# Patient Record
Sex: Female | Born: 1990
Health system: Southern US, Community
[De-identification: ages and names within clinical notes are randomized; demographics above are authoritative.]

## PROBLEM LIST (undated history)

## (undated) DIAGNOSIS — L309 Dermatitis, unspecified: Secondary | ICD-10-CM

## (undated) DIAGNOSIS — F32A Depression, unspecified: Secondary | ICD-10-CM

## (undated) DIAGNOSIS — F329 Major depressive disorder, single episode, unspecified: Secondary | ICD-10-CM

## (undated) DIAGNOSIS — IMO0002 Reserved for concepts with insufficient information to code with codable children: Secondary | ICD-10-CM

## (undated) DIAGNOSIS — L509 Urticaria, unspecified: Secondary | ICD-10-CM

## (undated) DIAGNOSIS — K219 Gastro-esophageal reflux disease without esophagitis: Secondary | ICD-10-CM

## (undated) DIAGNOSIS — D721 Eosinophilia, unspecified: Secondary | ICD-10-CM

## (undated) DIAGNOSIS — F419 Anxiety disorder, unspecified: Secondary | ICD-10-CM

## (undated) DIAGNOSIS — F909 Attention-deficit hyperactivity disorder, unspecified type: Secondary | ICD-10-CM

## (undated) DIAGNOSIS — D649 Anemia, unspecified: Secondary | ICD-10-CM

## (undated) DIAGNOSIS — J45909 Unspecified asthma, uncomplicated: Secondary | ICD-10-CM

## (undated) DIAGNOSIS — T783XXA Angioneurotic edema, initial encounter: Secondary | ICD-10-CM

## (undated) DIAGNOSIS — L508 Other urticaria: Secondary | ICD-10-CM

## (undated) DIAGNOSIS — L409 Psoriasis, unspecified: Secondary | ICD-10-CM

## (undated) HISTORY — DX: Reserved for concepts with insufficient information to code with codable children: IMO0002

## (undated) HISTORY — PX: ADENOIDECTOMY: SUR15

## (undated) HISTORY — PX: SINOSCOPY: SHX187

## (undated) HISTORY — PX: TYMPANOSTOMY TUBE PLACEMENT: SHX32

## (undated) HISTORY — PX: FRACTURE SURGERY: SHX138

## (undated) HISTORY — DX: Dermatitis, unspecified: L30.9

## (undated) HISTORY — DX: Eosinophilia, unspecified: D72.10

## (undated) HISTORY — DX: Other urticaria: L50.8

## (undated) HISTORY — DX: Unspecified asthma, uncomplicated: J45.909

## (undated) HISTORY — DX: Attention-deficit hyperactivity disorder, unspecified type: F90.9

## (undated) HISTORY — DX: Depression, unspecified: F32.A

## (undated) HISTORY — DX: Angioneurotic edema, initial encounter: T78.3XXA

## (undated) HISTORY — DX: Anemia, unspecified: D64.9

## (undated) HISTORY — DX: Urticaria, unspecified: L50.9

## (undated) HISTORY — DX: Psoriasis, unspecified: L40.9

## (undated) HISTORY — DX: Gastro-esophageal reflux disease without esophagitis: K21.9

## (undated) HISTORY — DX: Major depressive disorder, single episode, unspecified: F32.9

## (undated) HISTORY — DX: Anxiety disorder, unspecified: F41.9

---

## 2008-01-02 ENCOUNTER — Emergency Department (HOSPITAL_COMMUNITY): Admission: EM | Admit: 2008-01-02 | Discharge: 2008-01-02 | Payer: Self-pay | Admitting: Emergency Medicine

## 2008-02-09 ENCOUNTER — Other Ambulatory Visit: Admission: RE | Admit: 2008-02-09 | Discharge: 2008-02-09 | Payer: Self-pay | Admitting: Family Medicine

## 2008-03-13 ENCOUNTER — Other Ambulatory Visit: Admission: RE | Admit: 2008-03-13 | Discharge: 2008-03-13 | Payer: Self-pay | Admitting: Obstetrics and Gynecology

## 2008-06-09 ENCOUNTER — Inpatient Hospital Stay (HOSPITAL_COMMUNITY): Admission: EM | Admit: 2008-06-09 | Discharge: 2008-06-12 | Payer: Self-pay | Admitting: Emergency Medicine

## 2009-04-15 ENCOUNTER — Other Ambulatory Visit: Admission: RE | Admit: 2009-04-15 | Discharge: 2009-04-15 | Payer: Self-pay | Admitting: Family Medicine
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
## 2011-09-17 LAB — CBC
Hemoglobin: 11.3 — ABNORMAL LOW
Hemoglobin: 11.5 — ABNORMAL LOW
MCHC: 34.3
RBC: 3.4 — ABNORMAL LOW
RDW: 14.1
WBC: 8.2

## 2011-09-17 LAB — ETHANOL: Alcohol, Ethyl (B): <5

## 2011-09-17 LAB — POCT PREGNANCY, URINE
Operator id: 222501
Preg Test, Ur: NEGATIVE

## 2011-09-17 LAB — RAPID URINE DRUG SCREEN, HOSP PERFORMED: Benzodiazepines: NOT DETECTED

## 2014-05-20 ENCOUNTER — Ambulatory Visit (INDEPENDENT_AMBULATORY_CARE_PROVIDER_SITE_OTHER): Payer: BC Managed Care – PPO | Admitting: Family Medicine

## 2014-05-20 VITALS — BP 110/72 | HR 89 | Temp 97.7°F | Resp 12 | Ht 65.0 in | Wt 103.6 lb

## 2014-05-20 DIAGNOSIS — N390 Urinary tract infection, site not specified: Secondary | ICD-10-CM

## 2014-05-20 DIAGNOSIS — R319 Hematuria, unspecified: Secondary | ICD-10-CM

## 2014-05-20 DIAGNOSIS — R350 Frequency of micturition: Secondary | ICD-10-CM

## 2014-05-20 DIAGNOSIS — R1011 Right upper quadrant pain: Secondary | ICD-10-CM

## 2014-05-20 LAB — POCT URINALYSIS DIPSTICK
Bilirubin, UA: NEGATIVE
Glucose, UA: NEGATIVE
Ketones, UA: NEGATIVE
NITRITE UA: NEGATIVE
PH UA: 8.5
Protein, UA: NEGATIVE
Spec Grav, UA: 1.015
UROBILINOGEN UA: 0.2

## 2014-05-20 LAB — POCT UA - MICROSCOPIC ONLY
AMORPHOUS: POSITIVE
CASTS, UR, LPF, POC: NEGATIVE
CRYSTALS, UR, HPF, POC: NEGATIVE
YEAST UA: NEGATIVE

## 2014-05-20 LAB — POCT URINE PREGNANCY: PREG TEST UR: NEGATIVE

## 2014-05-20 MED ORDER — NITROFURANTOIN MONOHYD MACRO 100 MG PO CAPS: 100.0000 mg | ORAL_CAPSULE | Freq: Two times a day (BID) | ORAL | Status: DC

## 2014-05-20 NOTE — Patient Instructions (Signed)
1.  Repeat urine in two to three weeks to confirm that blood has resolved.

## 2014-05-20 NOTE — Progress Notes (Signed)
Subjective:    Patient ID: Mariah Sullivan, female    DOB: 08-23-1991, 23 y.o.   MRN: [id number]  05/20/2014  Abdominal Pain   Abdominal Pain Associated symptoms include dysuria, frequency and hematuria. Pertinent negatives include no fever.   This 23 y.o. female presents for evaluation of flank pain, abdominal pain, cloudy urine.  Onset two days ago.  Onset of pain in R side; made it through three hours yesterday but had to leave.  R anterior rib pain with radiation into R flank.  Weight loss in last month due to stress.  Back muscles are very tense.  Two days ago, urine was the color of sweet tea with red particles in urine at end of urination.  Googled over the weekend; stayed hydration.  Bought AZO cranberry tablets last night.  Mother thinks muscle but patient noticed swelling.  No fever/chills/sweats.  Slight dysuria; terrified of kidney stones.  Drinks a lot of sweet tea lately. +frequency; +urgency.  Drinking a lot of water lately; felt bloated.  Nocturia x 0.  No n/v/d.  Some constipation lately; did have normal b.m..  Known IBS with constipation; last b.m. Prior to today was one week ago.  Lots of stress.  No bloody stools.  Appetite is great.  Has lost weight over the past year; weighed 127 pounds one year ago; quit drinking beer and lost 20 pounds; weight went down from 127 to 110.  Weight decreased to 103 lately.  No vaginal discharge; no vaginal irritation, pain, burning.  Three weeks ago, had vaginal discharge that is now resolved.  Sexually active in past month.  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     PCP: [facility]  Review of Systems  Constitutional: Negative for fever, chills, diaphoresis and fatigue.  Gastrointestinal: Positive for abdominal pain.  Genitourinary: Positive for dysuria, urgency, frequency, hematuria and flank pain. Negative for decreased urine volume, vaginal bleeding, vaginal discharge, genital sores, vaginal pain, menstrual problem and pelvic pain.    Past Medical History    Diagnosis Date  . Anemia   . Anxiety   . Depression   . GERD (gastroesophageal reflux disease)   . Ulcer    Past Surgical History  Procedure Laterality Date  . Fracture surgery      No Known Allergies Current Outpatient Prescriptions  Medication Sig Dispense Refill  . amphetamine-dextroamphetamine (ADDERALL) 20 MG tablet Take 20 mg by mouth 3 (three) times daily.       No current facility-administered medications for this visit.   History   Social History  . Marital Status: Single    Spouse Name: N/A    Number of Children: N/A  . Years of Education: N/A   Occupational History  . Not on file.   Social History Main Topics  . Smoking status: Current Every Day Smoker -- 0.50 packs/day for 6 years    Types: Cigarettes  . Smokeless tobacco: Never Used  . Alcohol Use: Yes     Comment: social use  . Drug Use: Yes     Comment: marijuana 1 time per week  . Sexual Activity: Not on file   Other Topics Concern  . Not on file   Social History Narrative  . No narrative on file        Objective:    BP 110/72  Pulse 89  Temp(Src) 97.7 F (36.5 C) (Oral)  Resp 12  Ht 5\' 5"  (1.651 m)  Wt 103 lb 9.6 oz (46.993 kg)  BMI 17.24 kg/m2  SpO2  100%  LMP 03/17/2014 Physical Exam  Nursing note and vitals reviewed. Constitutional: She is oriented to person, place, and time. She appears well-developed and well-nourished. No distress.  HENT:  Head: Normocephalic and atraumatic.  Eyes: Conjunctivae are normal. Pupils are equal, round, and reactive to light.  Neck: Normal range of motion. Neck supple.  Cardiovascular: Normal rate, regular rhythm and normal heart sounds.  Exam reveals no gallop and no friction rub.   No murmur heard. Pulmonary/Chest: Effort normal and breath sounds normal. She has no wheezes. She has no rales.  Abdominal: Soft. Bowel sounds are normal. She exhibits no distension and no mass. There is no hepatosplenomegaly. There is tenderness in the right upper  quadrant. There is no rebound, no guarding and no CVA tenderness. No hernia.  Musculoskeletal:       Lumbar back: She exhibits normal range of motion, no tenderness, no pain and no spasm.  Lumbar spine: full ROM of lumbar spine; straight leg raises negative; toe and heel walking intact; motor 5/5 BLE.  Marching intact.  Neurological: She is alert and oriented to person, place, and time.  Skin: She is not diaphoretic.  Psychiatric: She has a normal mood and affect. Her behavior is normal.   Results for orders placed in visit on 05/20/14  POCT URINALYSIS DIPSTICK      Result Value Ref Range   Color, UA yellow     Clarity, UA cloudy     Glucose, UA neg     Bilirubin, UA neg     Ketones, UA neg     Spec Grav, UA 1.015     Blood, UA mod     pH, UA 8.5     Protein, UA neg     Urobilinogen, UA 0.2     Nitrite, UA neg     Leukocytes, UA small (1+)    POCT UA - MICROSCOPIC ONLY      Result Value Ref Range   WBC, Ur, HPF, POC 4-8     RBC, urine, microscopic 12-23     Bacteria, U Microscopic 2+     Mucus, UA trace     Epithelial cells, urine per micros 1-9     Crystals, Ur, HPF, POC neg     Casts, Ur, LPF, POC neg     Yeast, UA neg     Amorphous pos    POCT URINE PREGNANCY      Result Value Ref Range   Preg Test, Ur Negative         Assessment & Plan:  Abdominal pain, RUQ - Plan: POCT urinalysis dipstick, POCT UA - Microscopic Only, POCT urine pregnancy  Urinary frequency - Plan: POCT urinalysis dipstick, POCT UA - Microscopic Only, POCT urine pregnancy, Urine culture  Hematuria - Plan: POCT urinalysis dipstick, POCT UA - Microscopic Only, POCT urine pregnancy  1.  Abdominal pain RUQ:  New and now much improved.  Benign abdominal exam in office. 2.  Urinary frequency;  New. Associated with hematuria, R flank pain. Send urine culture; treat empirically with Macrobid.  RTC for fever, vomiting, recurrent flank pain. Did not image today due to benign exam and no active renal colic.     3. Hematuria:  New.  Ddx includes UTI/cystitis, nephrolithiasis.  Send urine culture; treat empirically for UTI. If urine culture negative, RTC for imaging.  Will warrant repeat u/a in 2-3 weeks to confirm urine normal and blood has resolved.  Meds ordered this encounter  Medications  . amphetamine-dextroamphetamine (  ADDERALL) 20 MG tablet    Sig: Take 20 mg by mouth 3 (three) times daily.    No Follow-up on file.   Nilda Simmer, M.D.  [facility] [address] [place], [place]  [address] [phone] phone [phone] fax

## 2014-05-23 LAB — URINE CULTURE: Colony Count: >=100000

## 2014-05-27 ENCOUNTER — Encounter: Payer: Self-pay | Admitting: *Deleted

## 2017-04-23 DIAGNOSIS — R062 Wheezing: Secondary | ICD-10-CM | POA: Diagnosis not present

## 2017-04-23 DIAGNOSIS — F909 Attention-deficit hyperactivity disorder, unspecified type: Secondary | ICD-10-CM | POA: Diagnosis not present

## 2017-04-23 DIAGNOSIS — F419 Anxiety disorder, unspecified: Secondary | ICD-10-CM | POA: Diagnosis not present

## 2017-08-24 DIAGNOSIS — Z3401 Encounter for supervision of normal first pregnancy, first trimester: Secondary | ICD-10-CM | POA: Diagnosis not present

## 2017-08-31 DIAGNOSIS — O3680X1 Pregnancy with inconclusive fetal viability, fetus 1: Secondary | ICD-10-CM | POA: Diagnosis not present

## 2017-10-01 DIAGNOSIS — Z23 Encounter for immunization: Secondary | ICD-10-CM | POA: Diagnosis not present

## 2017-10-12 DIAGNOSIS — F17210 Nicotine dependence, cigarettes, uncomplicated: Secondary | ICD-10-CM | POA: Diagnosis not present

## 2017-10-12 DIAGNOSIS — O99342 Other mental disorders complicating pregnancy, second trimester: Secondary | ICD-10-CM | POA: Diagnosis not present

## 2017-10-12 DIAGNOSIS — O99332 Smoking (tobacco) complicating pregnancy, second trimester: Secondary | ICD-10-CM | POA: Diagnosis not present

## 2017-10-12 DIAGNOSIS — O355XX0 Maternal care for (suspected) damage to fetus by drugs, not applicable or unspecified: Secondary | ICD-10-CM | POA: Diagnosis not present

## 2017-11-07 DIAGNOSIS — O99612 Diseases of the digestive system complicating pregnancy, second trimester: Secondary | ICD-10-CM | POA: Diagnosis not present

## 2017-11-07 DIAGNOSIS — Z3A22 22 weeks gestation of pregnancy: Secondary | ICD-10-CM | POA: Diagnosis not present

## 2017-11-07 DIAGNOSIS — F17210 Nicotine dependence, cigarettes, uncomplicated: Secondary | ICD-10-CM | POA: Diagnosis not present

## 2017-11-07 DIAGNOSIS — O219 Vomiting of pregnancy, unspecified: Secondary | ICD-10-CM | POA: Diagnosis not present

## 2017-11-07 DIAGNOSIS — K0889 Other specified disorders of teeth and supporting structures: Secondary | ICD-10-CM | POA: Diagnosis not present

## 2017-11-23 DIAGNOSIS — O99342 Other mental disorders complicating pregnancy, second trimester: Secondary | ICD-10-CM | POA: Diagnosis not present

## 2017-11-23 DIAGNOSIS — O355XX0 Maternal care for (suspected) damage to fetus by drugs, not applicable or unspecified: Secondary | ICD-10-CM | POA: Diagnosis not present

## 2018-03-02 DIAGNOSIS — O26893 Other specified pregnancy related conditions, third trimester: Secondary | ICD-10-CM | POA: Insufficient documentation

## 2018-03-02 DIAGNOSIS — F1291 Cannabis use, unspecified, in remission: Secondary | ICD-10-CM | POA: Insufficient documentation

## 2018-03-02 DIAGNOSIS — Z3A38 38 weeks gestation of pregnancy: Secondary | ICD-10-CM | POA: Insufficient documentation

## 2018-03-02 DIAGNOSIS — F419 Anxiety disorder, unspecified: Secondary | ICD-10-CM | POA: Insufficient documentation

## 2018-03-02 HISTORY — DX: Anxiety disorder, unspecified: F41.9

## 2021-08-13 DIAGNOSIS — D649 Anemia, unspecified: Secondary | ICD-10-CM | POA: Diagnosis not present

## 2021-08-13 DIAGNOSIS — L509 Urticaria, unspecified: Secondary | ICD-10-CM | POA: Diagnosis not present

## 2021-08-13 DIAGNOSIS — R531 Weakness: Secondary | ICD-10-CM | POA: Diagnosis not present

## 2021-08-22 DIAGNOSIS — F419 Anxiety disorder, unspecified: Secondary | ICD-10-CM | POA: Diagnosis not present

## 2021-08-22 DIAGNOSIS — F909 Attention-deficit hyperactivity disorder, unspecified type: Secondary | ICD-10-CM | POA: Diagnosis not present

## 2021-10-23 ENCOUNTER — Encounter: Payer: Self-pay | Admitting: Allergy

## 2021-10-23 ENCOUNTER — Ambulatory Visit (INDEPENDENT_AMBULATORY_CARE_PROVIDER_SITE_OTHER): Payer: Medicaid Other | Admitting: Allergy

## 2021-10-23 ENCOUNTER — Other Ambulatory Visit: Payer: Self-pay

## 2021-10-23 VITALS — BP 116/68 | HR 86 | Temp 98.2°F | Resp 18 | Ht 65.5 in | Wt 116.4 lb

## 2021-10-23 DIAGNOSIS — H109 Unspecified conjunctivitis: Secondary | ICD-10-CM

## 2021-10-23 DIAGNOSIS — J310 Chronic rhinitis: Secondary | ICD-10-CM

## 2021-10-23 DIAGNOSIS — L508 Other urticaria: Secondary | ICD-10-CM

## 2021-10-23 DIAGNOSIS — T783XXD Angioneurotic edema, subsequent encounter: Secondary | ICD-10-CM | POA: Diagnosis not present

## 2021-10-23 MED ORDER — FAMOTIDINE 20 MG PO TABS: 20.0000 mg | ORAL_TABLET | Freq: Two times a day (BID) | ORAL | 5 refills | Status: DC

## 2021-10-23 MED ORDER — LORATADINE 10 MG PO TABS: 10.0000 mg | ORAL_TABLET | Freq: Two times a day (BID) | ORAL | 5 refills | Status: DC

## 2021-10-23 NOTE — Patient Instructions (Addendum)
Chronic urticaria and swelling -at this time etiology of hives and swelling is unknown but you do have a component of physical urticaria with pressure triggering hives.  Hives can be caused by a variety of different triggers including illness/infection, foods, medications, stings, exercise, pressure, vibrations, extremes of temperature to name a few however majority of the time there is no identifiable trigger.  Your symptoms have been ongoing for >6 weeks making this chronic thus will obtain labwork to evaluate: tryptase, hive panel, environmental panel, alpha-gal panel -your CBC w diff, CMP, inflammatory markers and thyroid studies are largely unremarkable from August 2022 -for hive management recommend use of high-dose antihistamine regimen: Loratadine 10mg  1 tab twice day with Pepcid 20mg  1 tab twice a day -discussed t if high-dose antihistamine is not effective enough then we will will consider starting Xolair monthly injections for improved control  Environmental allergy  -will obtain environmental allergy via serum IgE levels as above -continue loratadine as above -can use for itchy, watery eyes, Pataday 1 drop each eye daily as needed -for nasal congestion use Flonase each nostril daily for 1-2 weeks at a time before stopping once nasal congestion improves for maximum benefit  Follow-up in 2 to 3 months or sooner if needed
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
## 2021-11-01 LAB — ALPHA-GAL PANEL
Allergen Lamb IgE: <0.1 kU/L
Beef IgE: <0.1 kU/L
IgE (Immunoglobulin E), Serum: 50 IU/mL (ref 6–495)
O215-IgE Alpha-Gal: <0.1 kU/L
Pork IgE: <0.1 kU/L

## 2021-11-01 LAB — CHRONIC URTICARIA: cu index: 3.4 (ref <10)

## 2021-11-01 LAB — THYROID ANTIBODIES
Thyroglobulin Antibody: <1 IU/mL (ref 0.0–0.9)
Thyroperoxidase Ab SerPl-aCnc: 13 IU/mL (ref 0–34)

## 2021-11-01 LAB — TRYPTASE: Tryptase: 5.5 ug/L (ref 2.2–13.2)

## 2021-11-17 ENCOUNTER — Telehealth: Payer: Self-pay | Admitting: *Deleted

## 2021-11-17 NOTE — Telephone Encounter (Signed)
Letter  

## 2021-12-31 DIAGNOSIS — D649 Anemia, unspecified: Secondary | ICD-10-CM | POA: Diagnosis not present

## 2022-01-12 DIAGNOSIS — R220 Localized swelling, mass and lump, head: Secondary | ICD-10-CM | POA: Diagnosis not present

## 2022-01-23 ENCOUNTER — Encounter: Payer: Self-pay | Admitting: *Deleted

## 2022-01-28 ENCOUNTER — Encounter: Payer: Self-pay | Admitting: Oncology

## 2022-01-28 ENCOUNTER — Inpatient Hospital Stay: Payer: Medicaid Other

## 2022-01-28 ENCOUNTER — Other Ambulatory Visit: Payer: Self-pay

## 2022-01-28 ENCOUNTER — Inpatient Hospital Stay: Payer: Medicaid Other | Attending: Oncology | Admitting: Oncology

## 2022-01-28 VITALS — BP 117/88 | HR 80 | Temp 99.6°F | Resp 16 | Ht 65.5 in | Wt 120.0 lb

## 2022-01-28 DIAGNOSIS — D721 Eosinophilia, unspecified: Secondary | ICD-10-CM

## 2022-01-28 DIAGNOSIS — D649 Anemia, unspecified: Secondary | ICD-10-CM

## 2022-01-28 LAB — CBC WITH DIFFERENTIAL/PLATELET
Abs Immature Granulocytes: 0.04 10*3/uL (ref 0.00–0.07)
Basophils Absolute: 0.1 10*3/uL (ref 0.0–0.1)
Basophils Relative: 1 %
Eosinophils Absolute: 0.8 10*3/uL — ABNORMAL HIGH (ref 0.0–0.5)
Eosinophils Relative: 15 %
HCT: 38.9 % (ref 36.0–46.0)
Hemoglobin: 13 g/dL (ref 12.0–15.0)
Immature Granulocytes: 1 %
Lymphocytes Relative: 34 %
Lymphs Abs: 1.9 10*3/uL (ref 0.7–4.0)
MCH: 33.4 pg (ref 26.0–34.0)
MCHC: 33.4 g/dL (ref 30.0–36.0)
MCV: 100 fL (ref 80.0–100.0)
Monocytes Absolute: 0.4 10*3/uL (ref 0.1–1.0)
Monocytes Relative: 6 %
Neutro Abs: 2.5 10*3/uL (ref 1.7–7.7)
Neutrophils Relative %: 43 %
Platelets: 250 10*3/uL (ref 150–400)
RBC: 3.89 MIL/uL (ref 3.87–5.11)
RDW: 13.2 % (ref 11.5–15.5)
WBC: 5.7 10*3/uL (ref 4.0–10.5)
nRBC: 0 % (ref 0.0–0.2)

## 2022-01-28 LAB — FERRITIN: Ferritin: 12 ng/mL (ref 11–307)

## 2022-01-28 LAB — IRON AND TIBC
Iron: 94 ug/dL (ref 28–170)
Saturation Ratios: 27 % (ref 10.4–31.8)
TIBC: 350 ug/dL (ref 250–450)
UIBC: 256 ug/dL

## 2022-01-28 LAB — TECHNOLOGIST SMEAR REVIEW: Plt Morphology: NORMAL

## 2022-01-28 LAB — VITAMIN B12: Vitamin B-12: 400 pg/mL (ref 180–914)

## 2022-01-28 LAB — RETICULOCYTES
Immature Retic Fract: 7.6 % (ref 2.3–15.9)
RBC.: 3.77 MIL/uL — ABNORMAL LOW (ref 3.87–5.11)
Retic Count, Absolute: 58.4 10*3/uL (ref 19.0–186.0)
Retic Ct Pct: 1.6 % (ref 0.4–3.1)

## 2022-01-28 LAB — FOLATE: Folate: 11.6 ng/mL (ref >5.9)

## 2022-01-28 LAB — TSH: TSH: 0.915 u[IU]/mL (ref 0.350–4.500)

## 2022-01-29 ENCOUNTER — Encounter: Payer: Self-pay | Admitting: Oncology

## 2022-01-29 LAB — ANA COMPREHENSIVE PANEL

## 2022-01-29 NOTE — Progress Notes (Addendum)
Hematology/Oncology Consult note [facility] [phone] [phone]  Patient Care Team: Orpah Melter, MD as PCP - General (Family Medicine) Sindy Guadeloupe, MD as Consulting Physician (Hematology)   Name of the patient: Mariah Sullivan  [id number]  [birth year]    Reason for referral- eosinophilia   Referring physician- Carolee Rota NP   Date of visit: 01/29/22   History of presenting illness- patient is a 31 year old female who states that she has had psoriasis since she was a child.  She has been referred for eosinophilia. Her most recent CBC both in August and January 2023 showed a hemoglobin of fluctuated between 10-11.  White count is normal with relative eosinophilia with an absolute eosinophil count between 600-700.  Platelet counts have been normal.  Patient states that she has been getting small random areas of hives which last for a few days before they go away.  She is also concerned about a bump in the back of her head.  Appetite and weight have otherwise remained stable.She had tryptase levels as well as chronic urticaria panel checked by PCP which was normal.  Alpha-gal panel normal.  Patient denies any abdominal pain, dysphagia or diarrhea.  Denies any significant shortness of breath although states that she had childhood asthma.  ECOG PS- 0  Pain scale- 0   Review of systems- Review of Systems  Constitutional:  Negative for chills, fever, malaise/fatigue and weight loss.  HENT:  Negative for congestion, ear discharge and nosebleeds.   Eyes:  Negative for blurred vision.  Respiratory:  Negative for cough, hemoptysis, sputum production, shortness of breath and wheezing.   Cardiovascular:  Negative for chest pain, palpitations, orthopnea and claudication.  Gastrointestinal:  Negative for abdominal pain, blood in stool, constipation, diarrhea, heartburn, melena, nausea and vomiting.  Genitourinary:  Negative for dysuria, flank  pain, frequency, hematuria and urgency.  Musculoskeletal:  Negative for back pain, joint pain and myalgias.  Skin:  Positive for rash.  Neurological:  Negative for dizziness, tingling, focal weakness, seizures, weakness and headaches.  Endo/Heme/Allergies:  Does not bruise/bleed easily.  Psychiatric/Behavioral:  Negative for depression and suicidal ideas. The patient does not have insomnia.    Allergies  Allergen Reactions   Latex Rash    There are no problems to display for this patient.    Past Medical History:  Diagnosis Date   ADHD    Anemia    Angio-edema    Anxiety    Asthma    Chronic urticaria    Depression    Eczema    Eosinophilia    GERD (gastroesophageal reflux disease)    Psoriasis    Ulcer    Urticaria      Past Surgical History:  Procedure Laterality Date   ADENOIDECTOMY     FRACTURE SURGERY     SINOSCOPY     TYMPANOSTOMY TUBE PLACEMENT      Social History   Socioeconomic History   Marital status: Single    Spouse name: Not on file   Number of children: Not on file   Years of education: Not on file   Highest education level: Not on file  Occupational History   Not on file  Tobacco Use   Smoking status: Former    Packs/day: 0.50    Years: 6.00    Pack years: 3.00    Types: Cigarettes   Smokeless tobacco: Never  Vaping Use   Vaping Use: Some days  Substance and Sexual Activity   Alcohol  use: Yes    Comment: social use not very often   Drug use: Yes    Types: Marijuana    Comment: marijuana 1 time per week   Sexual activity: Yes  Other Topics Concern   Not on file  Social History Narrative   Not on file   Social Determinants of Health   Financial Resource Strain: Not on file  Food Insecurity: Not on file  Transportation Needs: Not on file  Physical Activity: Not on file  Stress: Not on file  Social Connections: Not on file  Intimate Partner Violence: Not on file     Family History  Problem Relation Age of Onset    Allergic rhinitis Mother    Hyperlipidemia Mother    Asthma Father    Cancer Father    Diabetes Maternal Grandmother    Diabetes Maternal Grandfather    Heart disease Maternal Grandfather    Hyperlipidemia Maternal Grandfather    Stroke Paternal Grandmother    Eczema Neg Hx    Urticaria Neg Hx      Current Outpatient Medications:    acetaminophen (TYLENOL) 500 MG tablet, 1 tablet as needed, Disp: , Rfl:    amphetamine-dextroamphetamine (ADDERALL) 20 MG tablet, Take 20 mg by mouth 3 (three) times daily., Disp: , Rfl:    famotidine (PEPCID) 20 MG tablet, Take 1 tablet (20 mg total) by mouth 2 (two) times daily., Disp: 60 tablet, Rfl: 5   FLUoxetine (PROZAC) 20 MG capsule, Take 20 mg by mouth daily., Disp: , Rfl:    loratadine (CLARITIN) 10 MG tablet, Take 1 tablet (10 mg total) by mouth 2 (two) times daily., Disp: 60 tablet, Rfl: 5   Physical exam:  Vitals:   01/29/22 1312  BP: 117/88  Pulse: 80  Resp: 16  Temp: 99.6 F (37.6 C)  TempSrc: Tympanic  Weight: 120 lb (54.4 kg)  Height: 5' 5.5" (1.664 m)   Physical Exam HENT:     Head:     Comments: There is a palpable bony irregularity in the lower aspect of her skull which does not appear to be a mass or lymph node Eyes:     Pupils: Pupils are equal, round, and reactive to light.  Cardiovascular:     Rate and Rhythm: Normal rate and regular rhythm.     Heart sounds: Normal heart sounds.  Pulmonary:     Effort: Pulmonary effort is normal.     Breath sounds: Normal breath sounds.  Abdominal:     General: Bowel sounds are normal.     Palpations: Abdomen is soft.  Lymphadenopathy:     Comments: No palpable cervical, supraclavicular, axillary or inguinal adenopathy    Skin:    General: Skin is warm and dry.  Neurological:     Mental Status: She is alert and oriented to person, place, and time.       No flowsheet data found. CBC Latest Ref Rng & Units 01/28/2022  WBC 4.0 - 10.5 K/uL 5.7  Hemoglobin 12.0 - 15.0 g/dL  13.0  Hematocrit 36.0 - 46.0 % 38.9  Platelets 150 - 400 K/uL 250   Assessment and plan- Patient is a 31 y.o. female referred for eosinophilia  Eosinophilia: Currently mild and fluctuating between 600-700.  White count and platelets and hemoglobin otherwise normal and not suggestive of a myeloproliferative disorder.  Tryptase levels were normal.  I suspect this is probably secondary to underlying hypersensitivity given her on and off skin rashes.  I will plan to  check an autoimmune panel at this time and if it is unremarkable I will consider referring her to allergy immunology.  If autoimmune panel is positive I will refer her to rheumatology  I do not palpate any mass or lymph nodes in the lower aspect of her head.  What I feel is a bony abnormality which is likely a normal variant although patient states that she is only noticed for the last 2 to 3 weeks.  If there is any change in its size I will consider getting a CT head.  Normocytic anemia: We will do a complete anemia work-up including CBC ferritin and iron studies B12 folate TSH and myeloma panel  In person or video visit with me in 2 weeks   Thank you for this kind referral and the opportunity to participate in the care of this patient   Visit Diagnosis 1. Eosinophilia, unspecified type   2. Normocytic anemia     Dr. Randa Evens, MD, MPH [facility] [id number] 01/29/2022

## 2022-01-29 NOTE — Progress Notes (Signed)
Pt has a lump on her head that her PCP said she should come here. She eats good, has good BM and urinate good. She sometimes has sporadic chest pain for a few sec. And goes away. She has neuropathy only in real cold weather. She has skin issues where she can get rash and sometimes feels it is due to her ADD. She says sometimes she was told she has low sugar and next time a high sugar. The low times was when she was pregnant. Sometimes her hgb low and another time is is good. She states that she has anemia but when she takes iron pills she feels good. She stopped the iron pills 2 days before coming thinking that she may  need it out of her system.

## 2022-01-30 ENCOUNTER — Encounter: Payer: Self-pay | Admitting: Oncology

## 2022-02-02 LAB — MULTIPLE MYELOMA PANEL, SERUM
Albumin SerPl Elph-Mcnc: 4.1 g/dL (ref 2.9–4.4)
Albumin/Glob SerPl: 1.6 (ref 0.7–1.7)
Alpha 1: 0.2 g/dL (ref 0.0–0.4)
Alpha2 Glob SerPl Elph-Mcnc: 0.6 g/dL (ref 0.4–1.0)
B-Globulin SerPl Elph-Mcnc: 0.8 g/dL (ref 0.7–1.3)
Gamma Glob SerPl Elph-Mcnc: 0.9 g/dL (ref 0.4–1.8)
Globulin, Total: 2.6 g/dL (ref 2.2–3.9)
IgA: 57 mg/dL — ABNORMAL LOW (ref 87–352)
IgG (Immunoglobin G), Serum: 853 mg/dL (ref 586–1602)
IgM (Immunoglobulin M), Srm: 104 mg/dL (ref 26–217)
Total Protein ELP: 6.7 g/dL (ref 6.0–8.5)

## 2022-02-16 ENCOUNTER — Inpatient Hospital Stay: Payer: Medicaid Other | Admitting: Oncology

## 2022-02-16 ENCOUNTER — Encounter: Payer: Self-pay | Admitting: Oncology

## 2022-02-19 ENCOUNTER — Inpatient Hospital Stay: Payer: Medicaid Other | Attending: Oncology | Admitting: Oncology

## 2022-02-19 ENCOUNTER — Other Ambulatory Visit: Payer: Self-pay

## 2022-02-19 DIAGNOSIS — F909 Attention-deficit hyperactivity disorder, unspecified type: Secondary | ICD-10-CM | POA: Diagnosis not present

## 2022-02-19 DIAGNOSIS — F419 Anxiety disorder, unspecified: Secondary | ICD-10-CM | POA: Diagnosis not present

## 2022-02-20 ENCOUNTER — Telehealth: Payer: Self-pay | Admitting: Oncology

## 2022-02-20 NOTE — Telephone Encounter (Signed)
Left VM with patient to reschedule her appointment from 3/2 (per MD, they had difficulty connecting).  ? ?

## 2022-02-26 ENCOUNTER — Telehealth: Payer: Self-pay | Admitting: Oncology

## 2022-02-26 NOTE — Telephone Encounter (Signed)
Second attempt to reach patient. Left VM and requested she call back to get rescheduled with Dr. Janese Banks. Per MD: patient can have a virtual visit today 3/9 if she is available.  ?

## 2022-03-20 ENCOUNTER — Encounter: Payer: Self-pay | Admitting: Oncology

## 2022-03-20 ENCOUNTER — Inpatient Hospital Stay (HOSPITAL_BASED_OUTPATIENT_CLINIC_OR_DEPARTMENT_OTHER): Payer: Medicaid Other | Admitting: Oncology

## 2022-03-20 DIAGNOSIS — F329 Major depressive disorder, single episode, unspecified: Secondary | ICD-10-CM | POA: Insufficient documentation

## 2022-03-20 DIAGNOSIS — K219 Gastro-esophageal reflux disease without esophagitis: Secondary | ICD-10-CM | POA: Insufficient documentation

## 2022-03-20 DIAGNOSIS — F909 Attention-deficit hyperactivity disorder, unspecified type: Secondary | ICD-10-CM

## 2022-03-20 DIAGNOSIS — D649 Anemia, unspecified: Secondary | ICD-10-CM | POA: Diagnosis not present

## 2022-03-20 DIAGNOSIS — D721 Eosinophilia, unspecified: Secondary | ICD-10-CM | POA: Insufficient documentation

## 2022-03-20 DIAGNOSIS — R220 Localized swelling, mass and lump, head: Secondary | ICD-10-CM

## 2022-03-20 DIAGNOSIS — L409 Psoriasis, unspecified: Secondary | ICD-10-CM | POA: Insufficient documentation

## 2022-03-20 HISTORY — DX: Gastro-esophageal reflux disease without esophagitis: K21.9

## 2022-03-20 HISTORY — DX: Psoriasis, unspecified: L40.9

## 2022-03-20 HISTORY — DX: Major depressive disorder, single episode, unspecified: F32.9

## 2022-03-20 HISTORY — DX: Attention-deficit hyperactivity disorder, unspecified type: F90.9

## 2022-03-20 HISTORY — DX: Anemia, unspecified: D64.9

## 2022-03-20 HISTORY — DX: Eosinophilia, unspecified: D72.10

## 2022-03-20 NOTE — Progress Notes (Signed)
Pt states she was dealing with a strange chest pain (like a stinging) yesterday but subsided after awhile. Also, stated her sister is being tested for LUPUS in [place], and they have been having similar symptoms.  ?

## 2022-03-22 NOTE — Progress Notes (Signed)
I connected with Mariah Sullivan on 03/22/22 at  3:15 PM EDT by video enabled telemedicine visit and verified that I am speaking with the correct person using two identifiers. ?  ?I discussed the limitations, risks, security and privacy concerns of performing an evaluation and management service by telemedicine and the availability of in-person appointments. I also discussed with the patient that there may be a patient responsible charge related to this service. The patient expressed understanding and agreed to proceed. ? ?Other persons participating in the visit and their role in the encounter:  none ? ?Patient's location:  home ?Provider's location:  work ? ?Chief Complaint:  discuss results of bloodwork ? ?History of present illness: patient is a 31 year old female who states that she has had psoriasis since she was a child.  She has been referred for eosinophilia. Her most recent CBC both in August and January 2023 showed a hemoglobin of fluctuated between 10-11.  White count is normal with relative eosinophilia with an absolute eosinophil count between 600-700.  Platelet counts have been normal.  Patient states that she has been getting small random areas of hives which last for a few days before they go away.  She is also concerned about a bump in the back of her head.  Appetite and weight have otherwise remained stable.She had tryptase levels as well as chronic urticaria panel checked by PCP which was normal.  Alpha-gal panel normal.  Patient denies any abdominal pain, dysphagia or diarrhea.  Denies any significant shortness of breath although states that she had childhood asthma. ? ?Results of blood work from 01/28/2022 were as follows: CBC with differential was normal but differential showed mild eosinophilia with an absolute eosinophil count of 800.  B12 level normal at 400.  Ferritin level was low at 12 with an iron saturation of 27%.  Folate and TSH reticulocyte count normal.  ANA comprehensive panel normal.   Myeloma panel showed no M protein smear review unremarkable. ? ?Interval history patient still reports having random areas of skin rashes which come and go.  She is still concerned about her irregular swelling in her scalp.  Reports ongoing sinus issues with nasal congestion and drainage ? ? ?Review of Systems  ?Constitutional:  Positive for malaise/fatigue. Negative for chills, fever and weight loss.  ?HENT:  Negative for congestion, ear discharge and nosebleeds.   ?Eyes:  Negative for blurred vision.  ?Respiratory:  Negative for cough, hemoptysis, sputum production, shortness of breath and wheezing.   ?Cardiovascular:  Negative for chest pain, palpitations, orthopnea and claudication.  ?Gastrointestinal:  Negative for abdominal pain, blood in stool, constipation, diarrhea, heartburn, melena, nausea and vomiting.  ?Genitourinary:  Negative for dysuria, flank pain, frequency, hematuria and urgency.  ?Musculoskeletal:  Negative for back pain, joint pain and myalgias.  ?Skin:  Positive for rash.  ?Neurological:  Negative for dizziness, tingling, focal weakness, seizures, weakness and headaches.  ?Endo/Heme/Allergies:  Does not bruise/bleed easily.  ?Psychiatric/Behavioral:  Negative for depression and suicidal ideas. The patient does not have insomnia.   ? ?Allergies  ?Allergen Reactions  ? Latex Rash  ? ? ?Past Medical History:  ?Diagnosis Date  ? ADHD   ? Anemia   ? Angio-edema   ? Anxiety   ? Asthma   ? Chronic urticaria   ? Depression   ? Eczema   ? Eosinophilia   ? GERD (gastroesophageal reflux disease)   ? Psoriasis   ? Ulcer   ? Urticaria   ? ? ?Past Surgical History:  ?  Procedure Laterality Date  ? ADENOIDECTOMY    ? FRACTURE SURGERY    ? SINOSCOPY    ? TYMPANOSTOMY TUBE PLACEMENT    ? ? ?Social History  ? ?Socioeconomic History  ? Marital status: Single  ?  Spouse name: Not on file  ? Number of children: Not on file  ? Years of education: Not on file  ? Highest education level: Not on file  ?Occupational  History  ? Not on file  ?Tobacco Use  ? Smoking status: Former  ?  Packs/day: 0.50  ?  Years: 6.00  ?  Pack years: 3.00  ?  Types: Cigarettes  ? Smokeless tobacco: Never  ?Vaping Use  ? Vaping Use: Some days  ?Substance and Sexual Activity  ? Alcohol use: Yes  ?  Comment: social use not very often  ? Drug use: Yes  ?  Types: Marijuana  ?  Comment: marijuana 1 time per week  ? Sexual activity: Yes  ?Other Topics Concern  ? Not on file  ?Social History Narrative  ? Not on file  ? ?Social Determinants of Health  ? ?Financial Resource Strain: Not on file  ?Food Insecurity: Not on file  ?Transportation Needs: Not on file  ?Physical Activity: Not on file  ?Stress: Not on file  ?Social Connections: Not on file  ?Intimate Partner Violence: Not on file  ? ? ?Family History  ?Problem Relation Age of Onset  ? Allergic rhinitis Mother   ? Hyperlipidemia Mother   ? Asthma Father   ? Cancer Father   ? Diabetes Maternal Grandmother   ? Diabetes Maternal Grandfather   ? Heart disease Maternal Grandfather   ? Hyperlipidemia Maternal Grandfather   ? Stroke Paternal Grandmother   ? Eczema Neg Hx   ? Urticaria Neg Hx   ? ? ? ?Current Outpatient Medications:  ?  acetaminophen (TYLENOL) 500 MG tablet, 1 tablet as needed, Disp: , Rfl:  ?  amphetamine-dextroamphetamine (ADDERALL) 20 MG tablet, Take 20 mg by mouth 3 (three) times daily., Disp: , Rfl:  ?  ferrous sulfate 325 (65 FE) MG tablet, Take 325 mg by mouth daily with breakfast., Disp: , Rfl:  ?  FLUoxetine (PROZAC) 20 MG capsule, Take 20 mg by mouth daily., Disp: , Rfl:  ?  loratadine (CLARITIN) 10 MG tablet, Take 1 tablet (10 mg total) by mouth 2 (two) times daily., Disp: 60 tablet, Rfl: 5 ?  famotidine (PEPCID) 20 MG tablet, Take 1 tablet (20 mg total) by mouth 2 (two) times daily. (Patient not taking: Reported on 03/20/2022), Disp: 60 tablet, Rfl: 5 ? ?No results found. ? ?No images are attached to the encounter. ? ? ?   ? View : No data to display.  ?  ?  ?  ? ? ?  Latest Ref Rng  & Units 01/28/2022  ?  4:22 PM  ?CBC  ?WBC 4.0 - 10.5 K/uL 5.7    ?Hemoglobin 12.0 - 15.0 g/dL 13.0    ?Hematocrit 36.0 - 46.0 % 38.9    ?Platelets 150 - 400 K/uL 250    ? ? ? ?Observation/objective: Appears in no acute distress over video visit today.  Breathing is nonlabored ? ?Assessment and plan: Patient is a 31 year old female and this is a visit to discuss following issues: ? ?Eosinophilia: Mild with an absolute eosinophil count of 800.  Tryptase levels and allergy panel in the past has been normal.  We discussed referral to allergy immunology versus watchful monitoring and she  is agreeable with watchful monitoring at this time. ? ?Iron deficiency without anemia: Discussed low ferritin levels of 12And recommend oral iron 325 mg over-the-counter either every day or every other day. ? ?Patient has a bony abnormality involving her skull in the occipital region.  It does not appear as a soft tissue abnormality based on my initial exam previously.  However given her ongoing concerns I will proceed with a CT head without contrast at this time ? ?Follow-up instructions: CBC ferritin and iron studies in 3 months and I will see her thereafter ? ?I discussed the assessment and treatment plan with the patient. The patient was provided an opportunity to ask questions and all were answered. The patient agreed with the plan and demonstrated an understanding of the instructions. ?  ?The patient was advised to call back or seek an in-person evaluation if the symptoms worsen or if the condition fails to improve as anticipated. ? ?Visit Diagnosis: ?1. Head lump   ?2. Eosinophilia, unspecified type   ?3. Normocytic anemia   ? ? ?Dr. Randa Evens, MD, MPH ?[facility] ?Tel- [phone] ?03/22/2022 ?12:19 PM ? ?

## 2022-03-27 ENCOUNTER — Ambulatory Visit
Admission: RE | Admit: 2022-03-27 | Discharge: 2022-03-27 | Disposition: A | Discharge status: Home / Self Care | Payer: Medicaid Other | Source: Ambulatory Visit | Attending: Oncology | Admitting: Oncology

## 2022-03-27 DIAGNOSIS — R220 Localized swelling, mass and lump, head: Secondary | ICD-10-CM | POA: Insufficient documentation

## 2022-03-27 NOTE — Progress Notes (Signed)
Pt is aware of normal CT. Pt understands results. ?

## 2022-06-22 ENCOUNTER — Inpatient Hospital Stay: Payer: Medicaid Other | Attending: Oncology

## 2022-06-22 ENCOUNTER — Inpatient Hospital Stay: Payer: Medicaid Other | Admitting: Oncology

## 2022-06-24 ENCOUNTER — Encounter: Payer: Self-pay | Admitting: Oncology

## 2022-07-03 DIAGNOSIS — M545 Low back pain, unspecified: Secondary | ICD-10-CM | POA: Diagnosis not present

## 2022-08-20 DIAGNOSIS — F909 Attention-deficit hyperactivity disorder, unspecified type: Secondary | ICD-10-CM | POA: Diagnosis not present

## 2022-12-21 NOTE — L&D Delivery Note (Addendum)
[facility]N [facility] Delivery Note  Mariah Sullivan is a 33 y.o. G2P2002 s/p SVD at [redacted]w[redacted]d. She was admitted for spontaneous labor.   ROM: 0h 56m with thin meconium fluid GBS Status:  Negative/-- (07/09 1620) Maximum Maternal Temperature: 98.6 F  Labor Progress: Initial SVE: 4cm. She then progressed to complete.   Delivery Date/Time: 07/12/2023 at 1633 Delivery: Called to room and patient was complete and pushing. Head delivered OA with compound hand. No nuchal cord present, one loose body cord present. Shoulder and body delivered in usual fashion. Infant with spontaneous cry, placed on mother's abdomen, dried and stimulated. Cord clamped x 2 after 1-minute delay, and cut by father. Cord blood drawn. Placenta delivered spontaneously with gentle cord traction. Fundus firm with massage and Pitocin. Labia, perineum, vagina, and cervix inspected with bilateral periurethral lacerations, repaired.  Baby Weight: pending  Placenta: 3 vessel, intact. Sent to L&D. Complications: None Lacerations: bilateral periurethral lacerations, repaired.  EBL: 115 mL Analgesia: Epidural   Infant:  APGAR (1 MIN): 8  APGAR (5 MINS): 9   Glee Arvin, MD FM PGY-3 07/12/2023, 6:15 PM   Midwife attestation: I was gloved and present for delivery in its entirety and I agree with the above resident's note.  Just prior to delivery CNM attempted to confirm presentation via [place], and unsuccessful. Vertex presentation confirmed via Cervical Exam.   Carlynn Herald, CNM 07/12/2023 6:27 PM

## 2022-12-29 ENCOUNTER — Encounter: Payer: Self-pay | Admitting: Obstetrics and Gynecology

## 2022-12-29 ENCOUNTER — Other Ambulatory Visit (HOSPITAL_COMMUNITY)
Admission: RE | Admit: 2022-12-29 | Discharge: 2022-12-29 | Disposition: A | Discharge status: Home / Self Care | Payer: Medicaid Other | Source: Ambulatory Visit | Attending: Obstetrics and Gynecology | Admitting: Obstetrics and Gynecology

## 2022-12-29 ENCOUNTER — Ambulatory Visit (INDEPENDENT_AMBULATORY_CARE_PROVIDER_SITE_OTHER): Payer: Medicaid Other | Admitting: Obstetrics and Gynecology

## 2022-12-29 ENCOUNTER — Ambulatory Visit (INDEPENDENT_AMBULATORY_CARE_PROVIDER_SITE_OTHER): Payer: Medicaid Other

## 2022-12-29 VITALS — BP 123/81 | HR 99 | Wt 121.0 lb

## 2022-12-29 DIAGNOSIS — Z3A10 10 weeks gestation of pregnancy: Secondary | ICD-10-CM | POA: Diagnosis not present

## 2022-12-29 DIAGNOSIS — Z3A15 15 weeks gestation of pregnancy: Secondary | ICD-10-CM | POA: Diagnosis not present

## 2022-12-29 DIAGNOSIS — Z3480 Encounter for supervision of other normal pregnancy, unspecified trimester: Secondary | ICD-10-CM

## 2022-12-29 DIAGNOSIS — Z3481 Encounter for supervision of other normal pregnancy, first trimester: Secondary | ICD-10-CM

## 2022-12-29 NOTE — Addendum Note (Signed)
Addended by: Crosby Oyster on: 12/29/2022 02:10 PM   Modules accepted: Orders

## 2022-12-29 NOTE — Progress Notes (Signed)
Patient informed that the ultrasound is considered a limited obstetric ultrasound and is not intended to be a complete ultrasound exam.  Patient also informed that the ultrasound is not being completed with the intent of assessing for fetal or placental anomalies or any pelvic abnormalities. Explained that the purpose of today's ultrasound is to assess for fetal heart rate.  Patient acknowledges the purpose of the exam and the limitations of the study.         

## 2022-12-29 NOTE — Patient Instructions (Signed)
Second Trimester of Pregnancy  The second trimester of pregnancy is from week 13 through week 27. This is months 4 through 6 of pregnancy. The second trimester is often a time when you feel your best. Your body has adjusted to being pregnant, and you begin to feel better physically. During the second trimester: Morning sickness has lessened or stopped completely. You may have more energy. You may have an increase in appetite. The second trimester is also a time when the unborn baby (fetus) is growing rapidly. At the end of the sixth month, the fetus may be up to 12 inches long and weigh about 1 pounds. You will likely begin to feel the baby move (quickening) between 16 and 20 weeks of pregnancy. Body changes during your second trimester Your body continues to go through many changes during your second trimester. The changes vary and generally return to normal after the baby is born. Physical changes Your weight will continue to increase. You will notice your lower abdomen bulging out. You may begin to get stretch marks on your hips, abdomen, and breasts. Your breasts will continue to grow and to become tender. Dark spots or blotches (chloasma or mask of pregnancy) may develop on your face. A dark line from your belly button to the pubic area (linea nigra) may appear. You may have changes in your hair. These can include thickening of your hair, rapid growth, and changes in texture. Some people also have hair loss during or after pregnancy, or hair that feels dry or thin. Health changes You may develop headaches. You may have heartburn. You may develop constipation. You may develop hemorrhoids or swollen, bulging veins (varicose veins). Your gums may bleed and may be sensitive to brushing and flossing. You may urinate more often because the fetus is pressing on your bladder. You may have back pain. This is caused by: Weight gain. Pregnancy hormones that are relaxing the joints in your  pelvis. A shift in weight and the muscles that support your balance. Follow these instructions at home: Medicines Follow your health care provider's instructions regarding medicine use. Specific medicines may be either safe or unsafe to take during pregnancy. Do not take any medicines unless approved by your health care provider. Take a prenatal vitamin that contains at least 600 micrograms (mcg) of folic acid. Eating and drinking Eat a healthy diet that includes fresh fruits and vegetables, whole grains, good sources of protein such as meat, eggs, or tofu, and low-fat dairy products. Avoid raw meat and unpasteurized juice, milk, and cheese. These carry germs that can harm you and your baby. You may need to take these actions to prevent or treat constipation: Drink enough fluid to keep your urine pale yellow. Eat foods that are high in fiber, such as beans, whole grains, and fresh fruits and vegetables. Limit foods that are high in fat and processed sugars, such as fried or sweet foods. Activity Exercise only as directed by your health care provider. Most people can continue their usual exercise routine during pregnancy. Try to exercise for 30 minutes at least 5 days a week. Stop exercising if you develop contractions in your uterus. Stop exercising if you develop pain or cramping in the lower abdomen or lower back. Avoid exercising if it is very hot or humid or if you are at a high altitude. Avoid heavy lifting. If you choose to, you may have sex unless your health care provider tells you not to. Relieving pain and discomfort Wear a supportive   bra to prevent discomfort from breast tenderness. Take warm sitz baths to soothe any pain or discomfort caused by hemorrhoids. Use hemorrhoid cream if your health care provider approves. Rest with your legs raised (elevated) if you have leg cramps or low back pain. If you develop varicose veins: Wear support hose as told by your health care  provider. Elevate your feet for 15 minutes, 3-4 times a day. Limit salt in your diet. Safety Wear your seat belt at all times when driving or riding in a car. Talk with your health care provider if someone is verbally or physically abusive to you. Lifestyle Do not use hot tubs, steam rooms, or saunas. Do not douche. Do not use tampons or scented sanitary pads. Avoid cat litter boxes and soil used by cats. These carry germs that can cause birth defects in the baby and possibly loss of the fetus by miscarriage or stillbirth. Do not use herbal remedies, alcohol, illegal drugs, or medicines that are not approved by your health care provider. Chemicals in these products can harm your baby. Do not use any products that contain nicotine or tobacco, such as cigarettes, e-cigarettes, and chewing tobacco. If you need help quitting, ask your health care provider. General instructions During a routine prenatal visit, your health care provider will do a physical exam and other tests. He or she will also discuss your overall health. Keep all follow-up visits. This is important. Ask your health care provider for a referral to a local prenatal education class. Ask for help if you have counseling or nutritional needs during pregnancy. Your health care provider can offer advice or refer you to specialists for help with various needs. Where to find more information American Pregnancy Association: americanpregnancy.org American College of Obstetricians and Gynecologists: acog.org/en/Womens%20Health/Pregnancy Office on Women's Health: womenshealth.gov/pregnancy Contact a health care provider if you have: A headache that does not go away when you take medicine. Vision changes or you see spots in front of your eyes. Mild pelvic cramps, pelvic pressure, or nagging pain in the abdominal area. Persistent nausea, vomiting, or diarrhea. A bad-smelling vaginal discharge or foul-smelling urine. Pain when you  urinate. Sudden or extreme swelling of your face, hands, ankles, feet, or legs. A fever. Get help right away if you: Have fluid leaking from your vagina. Have spotting or bleeding from your vagina. Have severe abdominal cramping or pain. Have difficulty breathing. Have chest pain. Have fainting spells. Have not felt your baby move for the time period told by your health care provider. Have new or increased pain, swelling, or redness in an arm or leg. Summary The second trimester of pregnancy is from week 13 through week 27 (months 4 through 6). Do not use herbal remedies, alcohol, illegal drugs, or medicines that are not approved by your health care provider. Chemicals in these products can harm your baby. Exercise only as directed by your health care provider. Most people can continue their usual exercise routine during pregnancy. Keep all follow-up visits. This is important. This information is not intended to replace advice given to you by your health care provider. Make sure you discuss any questions you have with your health care provider. Document Revised: 05/15/2020 Document Reviewed: 03/21/2020 Elsevier Patient Education  2023 Elsevier Inc.  Contraception Choices Contraception, also called birth control, refers to methods or devices that prevent pregnancy. Hormonal methods  Contraceptive implant A contraceptive implant is a thin, plastic tube that contains a hormone that prevents pregnancy. It is different from an intrauterine device (  IUD). It is inserted into the upper part of the arm by a health care provider. Implants can be effective for up to 3 years. Progestin-only injections Progestin-only injections are injections of progestin, a synthetic form of the hormone progesterone. They are given every 3 months by a health care provider. Birth control pills Birth control pills are pills that contain hormones that prevent pregnancy. They must be taken once a day, preferably at  the same time each day. A prescription is needed to use this method of contraception. Birth control patch The birth control patch contains hormones that prevent pregnancy. It is placed on the skin and must be changed once a week for three weeks and removed on the fourth week. A prescription is needed to use this method of contraception. Vaginal ring A vaginal ring contains hormones that prevent pregnancy. It is placed in the vagina for three weeks and removed on the fourth week. After that, the process is repeated with a new ring. A prescription is needed to use this method of contraception. Emergency contraceptive Emergency contraceptives prevent pregnancy after unprotected sex. They come in pill form and can be taken up to 5 days after sex. They work best the sooner they are taken after having sex. Most emergency contraceptives are available without a prescription. This method should not be used as your only form of birth control. Barrier methods  Female condom A female condom is a thin sheath that is worn over the penis during sex. Condoms keep sperm from going inside a woman's body. They can be used with a sperm-killing substance (spermicide) to increase their effectiveness. They should be thrown away after one use. Female condom A female condom is a soft, loose-fitting sheath that is put into the vagina before sex. The condom keeps sperm from going inside a woman's body. They should be thrown away after one use. Diaphragm A diaphragm is a soft, dome-shaped barrier. It is inserted into the vagina before sex, along with a spermicide. The diaphragm blocks sperm from entering the uterus, and the spermicide kills sperm. A diaphragm should be left in the vagina for 6-8 hours after sex and removed within 24 hours. A diaphragm is prescribed and fitted by a health care provider. A diaphragm should be replaced every 1-2 years, after giving birth, after gaining more than 15 lb (6.8 kg), and after pelvic  surgery. Cervical cap A cervical cap is a round, soft latex or plastic cup that fits over the cervix. It is inserted into the vagina before sex, along with spermicide. It blocks sperm from entering the uterus. The cap should be left in place for 6-8 hours after sex and removed within 48 hours. A cervical cap must be prescribed and fitted by a health care provider. It should be replaced every 2 years. Sponge A sponge is a soft, circular piece of polyurethane foam with spermicide in it. The sponge helps block sperm from entering the uterus, and the spermicide kills sperm. To use it, you make it wet and then insert it into the vagina. It should be inserted before sex, left in for at least 6 hours after sex, and removed and thrown away within 30 hours. Spermicides Spermicides are chemicals that kill or block sperm from entering the cervix and uterus. They can come as a cream, jelly, suppository, foam, or tablet. A spermicide should be inserted into the vagina with an applicator at least 10-15 minutes before sex to allow time for it to work. The process must be   repeated every time you have sex. Spermicides do not require a prescription. Intrauterine contraception Intrauterine device (IUD) An IUD is a T-shaped device that is put in a woman's uterus. There are two types: Hormone IUD.This type contains progestin, a synthetic form of the hormone progesterone. This type can stay in place for 3-5 years. Copper IUD.This type is wrapped in copper wire. It can stay in place for 10 years. Permanent methods of contraception Female tubal ligation In this method, a woman's fallopian tubes are sealed, tied, or blocked during surgery to prevent eggs from traveling to the uterus. Hysteroscopic sterilization In this method, a small, flexible insert is placed into each fallopian tube. The inserts cause scar tissue to form in the fallopian tubes and block them, so sperm cannot reach an egg. The procedure takes about 3  months to be effective. Another form of birth control must be used during those 3 months. Female sterilization This is a procedure to tie off the tubes that carry sperm (vasectomy). After the procedure, the man can still ejaculate fluid (semen). Another form of birth control must be used for 3 months after the procedure. Natural planning methods Natural family planning In this method, a couple does not have sex on days when the woman could become pregnant. Calendar method In this method, the woman keeps track of the length of each menstrual cycle, identifies the days when pregnancy can happen, and does not have sex on those days. Ovulation method In this method, a couple avoids sex during ovulation. Symptothermal method This method involves not having sex during ovulation. The woman typically checks for ovulation by watching changes in her temperature and in the consistency of cervical mucus. Post-ovulation method In this method, a couple waits to have sex until after ovulation. Where to find more information Centers for Disease Control and Prevention: www.cdc.gov Summary Contraception, also called birth control, refers to methods or devices that prevent pregnancy. Hormonal methods of contraception include implants, injections, pills, patches, vaginal rings, and emergency contraceptives. Barrier methods of contraception can include female condoms, female condoms, diaphragms, cervical caps, sponges, and spermicides. There are two types of IUDs (intrauterine devices). An IUD can be put in a woman's uterus to prevent pregnancy for 3-5 years. Permanent sterilization can be done through a procedure for males and females. Natural family planning methods involve nothaving sex on days when the woman could become pregnant. This information is not intended to replace advice given to you by your health care provider. Make sure you discuss any questions you have with your health care provider. Document  Revised: 05/13/2020 Document Reviewed: 05/13/2020 Elsevier Patient Education  2023 Elsevier Inc.  

## 2022-12-29 NOTE — Progress Notes (Signed)
Subjective:    Mariah Sullivan is a G2P1001 [redacted]w[redacted]d being seen today for her first obstetrical visit.  Her obstetrical history is significant for previous term SVD 7lb5oz. Patient does intend to breast feed. Pregnancy history fully reviewed.  Patient reports some occasional nausea but has been able to maintain a good appetite.  Vitals:   12/29/22 1315  BP: 123/81  Pulse: 99  Weight: 121 lb (54.9 kg)    HISTORY: OB History  Gravida Para Term Preterm AB Living  2 1 1     1   SAB IAB Ectopic Multiple Live Births          1    # Outcome Date GA Lbr Len/2nd Weight Sex Delivery Anes PTL Lv  2 Current           1 Term 03/02/18    M Vag-Spont   LIV   Past Medical History:  Diagnosis Date   ADHD    Anemia    Angio-edema    Anxiety    Asthma    Chronic urticaria    Depression    Eczema    Eosinophilia    GERD (gastroesophageal reflux disease)    Psoriasis    Ulcer    Urticaria    Past Surgical History:  Procedure Laterality Date   ADENOIDECTOMY     FRACTURE SURGERY     SINOSCOPY     TYMPANOSTOMY TUBE PLACEMENT     Family History  Problem Relation Age of Onset   Allergic rhinitis Mother    Hyperlipidemia Mother    Asthma Father    Cancer Father    Diabetes Maternal Grandmother    Diabetes Maternal Grandfather    Heart disease Maternal Grandfather    Hyperlipidemia Maternal Grandfather    Stroke Paternal Grandmother    Eczema Neg Hx    Urticaria Neg Hx      Exam    Uterus:   10-weeks  Pelvic Exam:    Perineum: No Hemorrhoids, Normal Perineum   Vulva: normal   Vagina:  normal mucosa, normal discharge   pH:    Cervix: multiparous appearance and cervix is closed and long   Adnexa: no mass, fullness, tenderness   Bony Pelvis: gynecoid  System: Breast:  normal appearance, no masses or tenderness   Skin: normal coloration and turgor, no rashes    Neurologic: oriented, no focal deficits   Extremities: normal strength, tone, and muscle mass   HEENT extra  ocular movement intact   Mouth/Teeth mucous membranes moist, pharynx normal without lesions and dental hygiene good   Neck supple and no masses   Cardiovascular: regular rate and rhythm   Respiratory:  appears well, vitals normal, no respiratory distress, acyanotic, normal RR   Abdomen: soft, non-tender; bowel sounds normal; no masses,  no organomegaly   Urinary:       Assessment:    Pregnancy: G2P1001 Patient Active Problem List   Diagnosis Date Noted   Supervision of other normal pregnancy, antepartum 12/29/2022   Anemia 03/20/2022   Attention deficit hyperactivity disorder 03/20/2022   Eosinophilia, unspecified 03/20/2022   Gastroesophageal reflux disease without esophagitis 03/20/2022   Major depression, single episode 03/20/2022   Psoriasis 03/20/2022   SVD (spontaneous vaginal delivery) 03/03/2018   Term birth of newborn female 03/03/2018   [redacted] weeks gestation of pregnancy 03/02/2018   Abdominal pain during pregnancy in third trimester 03/02/2018   Anxiety 03/02/2018   History of marijuana use 03/02/2018  Plan:     Initial labs drawn. Prenatal vitamins. Problem list reviewed and updated. Genetic Screening discussed : Panorama ordered.  Ultrasound discussed; fetal survey: ordered.  Follow up in 4 weeks. 50% of 30 min visit spent on counseling and coordination of care.     Calven Gilkes 12/29/2022

## 2022-12-30 LAB — CBC/D/PLT+RPR+RH+ABO+RUBIGG...
Antibody Screen: NEGATIVE
Basophils Absolute: 0.1 10*3/uL (ref 0.0–0.2)
Basos: 1 %
EOS (ABSOLUTE): 0.5 10*3/uL — ABNORMAL HIGH (ref 0.0–0.4)
Eos: 6 %
HCV Ab: NONREACTIVE
HIV Screen 4th Generation wRfx: NONREACTIVE
Hematocrit: 37.2 % (ref 34.0–46.6)
Hemoglobin: 12.8 g/dL (ref 11.1–15.9)
Hepatitis B Surface Ag: NEGATIVE
Immature Grans (Abs): 0 10*3/uL (ref 0.0–0.1)
Immature Granulocytes: 0 %
Lymphocytes Absolute: 1.4 10*3/uL (ref 0.7–3.1)
Lymphs: 18 %
MCH: 34.1 pg — ABNORMAL HIGH (ref 26.6–33.0)
MCHC: 34.4 g/dL (ref 31.5–35.7)
MCV: 99 fL — ABNORMAL HIGH (ref 79–97)
Monocytes Absolute: 0.4 10*3/uL (ref 0.1–0.9)
Monocytes: 5 %
Neutrophils Absolute: 5.5 10*3/uL (ref 1.4–7.0)
Neutrophils: 70 %
Platelets: 281 10*3/uL (ref 150–450)
RBC: 3.75 x10E6/uL — ABNORMAL LOW (ref 3.77–5.28)
RDW: 11.3 % — ABNORMAL LOW (ref 11.7–15.4)
RPR Ser Ql: NONREACTIVE
Rh Factor: POSITIVE
Rubella Antibodies, IGG: 6.44 index (ref >0.99)
WBC: 7.9 10*3/uL (ref 3.4–10.8)

## 2022-12-30 LAB — HCV INTERPRETATION

## 2022-12-31 LAB — CYTOLOGY - PAP
Chlamydia: NEGATIVE
Comment: NEGATIVE
Comment: NEGATIVE
Comment: NORMAL
Diagnosis: NEGATIVE
High risk HPV: NEGATIVE
Neisseria Gonorrhea: NEGATIVE

## 2022-12-31 LAB — CULTURE, OB URINE

## 2022-12-31 LAB — URINE CULTURE, OB REFLEX

## 2023-01-05 LAB — PANORAMA PRENATAL TEST FULL PANEL:PANORAMA TEST PLUS 5 ADDITIONAL MICRODELETIONS: FETAL FRACTION: 14.8

## 2023-01-08 LAB — HORIZON CUSTOM: REPORT SUMMARY: NEGATIVE

## 2023-01-11 ENCOUNTER — Encounter: Payer: Self-pay | Admitting: Obstetrics and Gynecology

## 2023-01-21 DIAGNOSIS — F909 Attention-deficit hyperactivity disorder, unspecified type: Secondary | ICD-10-CM | POA: Diagnosis not present

## 2023-01-27 ENCOUNTER — Ambulatory Visit (INDEPENDENT_AMBULATORY_CARE_PROVIDER_SITE_OTHER): Payer: Medicaid Other | Admitting: Obstetrics and Gynecology

## 2023-01-27 VITALS — BP 126/78 | HR 99 | Wt 123.6 lb

## 2023-01-27 DIAGNOSIS — Z3482 Encounter for supervision of other normal pregnancy, second trimester: Secondary | ICD-10-CM

## 2023-01-27 DIAGNOSIS — Z3A14 14 weeks gestation of pregnancy: Secondary | ICD-10-CM

## 2023-01-27 DIAGNOSIS — R718 Other abnormality of red blood cells: Secondary | ICD-10-CM

## 2023-01-27 DIAGNOSIS — L409 Psoriasis, unspecified: Secondary | ICD-10-CM

## 2023-01-27 NOTE — Progress Notes (Signed)
CC: Routine OB  Denies any concerns today

## 2023-01-27 NOTE — Progress Notes (Signed)
   PRENATAL VISIT NOTE  Subjective:  Mariah Sullivan is a 32 y.o. G2P1001 at [redacted]w[redacted]d being seen today for ongoing prenatal care.  She is currently monitored for the following issues for this low-risk pregnancy and has Anemia; Anxiety; Attention deficit hyperactivity disorder; Eosinophilia, unspecified; Gastroesophageal reflux disease without esophagitis; History of marijuana use; Major depression, single episode; Psoriasis; and Supervision of other normal pregnancy, antepartum on their problem list.  Patient reports  morning sickness improving .  Contractions: Not present. Vag. Bleeding: None.  Movement: Present. Denies leaking of fluid.   The following portions of the patient's history were reviewed and updated as appropriate: allergies, current medications, past family history, past medical history, past social history, past surgical history and problem list.   Objective:   Vitals:   01/27/23 1119  BP: 126/78  Pulse: 99  Weight: 123 lb 9.6 oz (56.1 kg)    Fetal Status: Fetal Heart Rate (bpm): 151   Movement: Present     General:  Alert, oriented and cooperative. Patient is in no acute distress.  Skin: Skin is warm and dry. No rash noted.   Cardiovascular: Normal heart rate noted  Respiratory: Normal respiratory effort, no problems with respiration noted  Abdomen: Soft, gravid, appropriate for gestational age.  Pain/Pressure: Absent     Pelvic: Cervical exam deferred        Extremities: Normal range of motion.  Edema: None  Mental Status: Normal mood and affect. Normal behavior. Normal judgment and thought content.   Assessment and Plan:  Pregnancy: G2P1001 at [redacted]w[redacted]d 1. [redacted] weeks gestation of pregnancy Afp next visit and recommend ferritin, Q46 and folic acid, which patient is amenable to  Anatomy u/s already scheduled  2. Elevated MCV  3. Psoriasis No issues.   Preterm labor symptoms and general obstetric precautions including but not limited to vaginal bleeding, contractions,  leaking of fluid and fetal movement were reviewed in detail with the patient. Please refer to After Visit Summary for other counseling recommendations.   Return in about 3 weeks (around 02/17/2023) for md or app, low risk ob, in person.  Future Appointments  Date Time Provider [place]  02/24/2023 11:15 AM Donnamae Jude, MD [facility]  03/03/2023  1:45 PM [facility] US5 [facility]    Aletha Halim, MD

## 2023-02-24 ENCOUNTER — Ambulatory Visit (INDEPENDENT_AMBULATORY_CARE_PROVIDER_SITE_OTHER): Payer: Medicaid Other | Admitting: Family Medicine

## 2023-02-24 VITALS — BP 113/75 | HR 92 | Wt 129.0 lb

## 2023-02-24 DIAGNOSIS — O99013 Anemia complicating pregnancy, third trimester: Secondary | ICD-10-CM

## 2023-02-24 DIAGNOSIS — D649 Anemia, unspecified: Secondary | ICD-10-CM

## 2023-02-24 DIAGNOSIS — F419 Anxiety disorder, unspecified: Secondary | ICD-10-CM

## 2023-02-24 DIAGNOSIS — O99343 Other mental disorders complicating pregnancy, third trimester: Secondary | ICD-10-CM

## 2023-02-24 DIAGNOSIS — F900 Attention-deficit hyperactivity disorder, predominantly inattentive type: Secondary | ICD-10-CM

## 2023-02-24 DIAGNOSIS — Z3A18 18 weeks gestation of pregnancy: Secondary | ICD-10-CM

## 2023-02-24 DIAGNOSIS — Z3480 Encounter for supervision of other normal pregnancy, unspecified trimester: Secondary | ICD-10-CM

## 2023-02-24 NOTE — Progress Notes (Signed)
Wants to check to see if Adderall or Prozac are safe for pregnancy.

## 2023-02-24 NOTE — Progress Notes (Signed)
    PRENATAL VISIT NOTE  Subjective:  Colee Drye is a 32 y.o. G2P1001 at 34w2dbeing seen today for ongoing prenatal care.  She is currently monitored for the following issues for this low-risk pregnancy and has Anemia; Anxiety; Attention deficit hyperactivity disorder; Eosinophilia, unspecified; Gastroesophageal reflux disease without esophagitis; History of marijuana use; Major depression, single episode; Psoriasis; and Supervision of other normal pregnancy, antepartum on their problem list.  Patient reports no complaints.   .  .  Movement: Present. Denies leaking of fluid.   The following portions of the patient's history were reviewed and updated as appropriate: allergies, current medications, past family history, past medical history, past social history, past surgical history and problem list.   Objective:   Vitals:   02/24/23 1125  BP: 113/75  Pulse: 92  Weight: 129 lb (58.5 kg)    Fetal Status: Fetal Heart Rate (bpm): 146   Movement: Present     General:  Alert, oriented and cooperative. Patient is in no acute distress.  Skin: Skin is warm and dry. No rash noted.   Cardiovascular: Normal heart rate noted  Respiratory: Normal respiratory effort, no problems with respiration noted  Abdomen: Soft, gravid, appropriate for gestational age.  Pain/Pressure: Absent     Pelvic: Cervical exam deferred        Extremities: Normal range of motion.     Mental Status: Normal mood and affect. Normal behavior. Normal judgment and thought content.   Assessment and Plan:  Pregnancy: G2P1001 at 155w2d. Anemia, unspecified type Check Ferritin and B 12/folate today - Ferritin - B12 and Folate Panel  2. Supervision of other normal pregnancy, antepartum AFP today - AFP, Serum, Open Spina Bifida  3. Attention deficit hyperactivity disorder (ADHD), predominantly inattentive type Discussed R/B of stimulant and discouraged routine use during pregnancy, and if needed to keep her job and  taking in small increments, may choose to take this  4. Anxiety Improved outcomes while on meds in pregnancy. Discussed small risk of persistent pulmonary hypertension   Preterm labor symptoms and general obstetric precautions including but not limited to vaginal bleeding, contractions, leaking of fluid and fetal movement were reviewed in detail with the patient. Please refer to After Visit Summary for other counseling recommendations.   Return in 4 weeks (on 03/24/2023).  Future Appointments  Date Time Provider De[place]ling[place]3/13/2024  1:45 PM [facility]/02[facility]  3:30 PM PrDonnamae JudeMD [facility]  04/21/2023  3:30 PM PiAletha HalimMD [facility]  05/05/2023  3:30 PM PiAletha HalimMD [facility]  05/19/2023  3:30 PM PrDonnamae JudeMD [facility]    TaDonnamae JudeMD

## 2023-02-26 LAB — AFP, SERUM, OPEN SPINA BIFIDA
AFP MoM: 0.94
AFP Value: 46.9 ng/mL
Gest. Age on Collection Date: 18.2 weeks
Maternal Age At EDD: 34 yr
OSBR Risk 1 IN: 10000
Test Results:: NEGATIVE
Weight: 129 [lb_av]

## 2023-02-26 LAB — B12 AND FOLATE PANEL
Folate: 12.1 ng/mL (ref >3.0)
Vitamin B-12: 386 pg/mL (ref 232–1245)

## 2023-02-26 LAB — FERRITIN: Ferritin: 24 ng/mL (ref 15–150)

## 2023-03-03 ENCOUNTER — Other Ambulatory Visit: Payer: Self-pay | Admitting: *Deleted

## 2023-03-03 ENCOUNTER — Ambulatory Visit: Payer: Medicaid Other | Attending: Obstetrics and Gynecology

## 2023-03-03 DIAGNOSIS — Z3480 Encounter for supervision of other normal pregnancy, unspecified trimester: Secondary | ICD-10-CM | POA: Diagnosis not present

## 2023-03-03 DIAGNOSIS — O321XX0 Maternal care for breech presentation, not applicable or unspecified: Secondary | ICD-10-CM | POA: Insufficient documentation

## 2023-03-03 DIAGNOSIS — Z3A19 19 weeks gestation of pregnancy: Secondary | ICD-10-CM | POA: Diagnosis not present

## 2023-03-03 DIAGNOSIS — Z362 Encounter for other antenatal screening follow-up: Secondary | ICD-10-CM

## 2023-03-03 DIAGNOSIS — Z363 Encounter for antenatal screening for malformations: Secondary | ICD-10-CM | POA: Diagnosis not present

## 2023-03-11 ENCOUNTER — Telehealth: Payer: Self-pay | Admitting: *Deleted

## 2023-03-11 ENCOUNTER — Other Ambulatory Visit: Payer: Self-pay | Admitting: *Deleted

## 2023-03-11 DIAGNOSIS — O99019 Anemia complicating pregnancy, unspecified trimester: Secondary | ICD-10-CM

## 2023-03-11 NOTE — Telephone Encounter (Signed)
-----   Message from Sindy Guadeloupe, MD sent at 03/08/2023  8:39 AM EDT ----- Her ferritin is low and she is pregnant. She should take po iron. Check cbc ferritn and and iron studies in about 10 weeks and see me

## 2023-03-11 NOTE — Telephone Encounter (Signed)
Dr. Janese Banks sent me a message that the pt. Is pregnant and she needs to take iron pill daily with food so it does not get stomach upset and take one over the counter anywhere she can find it. If she has questions she can call and I left the direct line for out team. Also dr Janese Banks wants pt to have labs in 10 weeks and see MD . will send her the date. I got Megan to put in appt and I printed her appts and sent it to be mailed for the pt.

## 2023-03-24 ENCOUNTER — Encounter: Payer: Self-pay | Admitting: Family Medicine

## 2023-03-24 ENCOUNTER — Telehealth: Payer: Self-pay

## 2023-03-24 ENCOUNTER — Encounter: Payer: Medicaid Other | Admitting: Family Medicine

## 2023-03-24 NOTE — Telephone Encounter (Signed)
Left message w/ pt regarding missed appt to call office and reschedule

## 2023-03-24 NOTE — Progress Notes (Signed)
Patient did not keep appointment today. She will be called to reschedule.  

## 2023-03-29 ENCOUNTER — Ambulatory Visit (INDEPENDENT_AMBULATORY_CARE_PROVIDER_SITE_OTHER): Payer: Medicaid Other | Admitting: Obstetrics & Gynecology

## 2023-03-29 ENCOUNTER — Encounter: Payer: Self-pay | Admitting: Obstetrics & Gynecology

## 2023-03-29 VITALS — BP 115/77 | HR 80 | Wt 132.0 lb

## 2023-03-29 DIAGNOSIS — Z3482 Encounter for supervision of other normal pregnancy, second trimester: Secondary | ICD-10-CM

## 2023-03-29 DIAGNOSIS — R040 Epistaxis: Secondary | ICD-10-CM

## 2023-03-29 DIAGNOSIS — Z3480 Encounter for supervision of other normal pregnancy, unspecified trimester: Secondary | ICD-10-CM

## 2023-03-29 DIAGNOSIS — Z3A23 23 weeks gestation of pregnancy: Secondary | ICD-10-CM

## 2023-03-29 MED ORDER — SALINE SPRAY 0.65 % NA SOLN
1.0000 | NASAL | 2 refills | Status: DC | PRN
Start: 2023-03-29 — End: 2023-08-26

## 2023-03-29 NOTE — Progress Notes (Signed)
PRENATAL VISIT NOTE  Subjective:  Mariah Sullivan is a 32 y.o. G2P1001 at [redacted]w[redacted]d being seen today for ongoing prenatal care.  She is currently monitored for the following issues for this low-risk pregnancy and has Anemia; Anxiety; Attention deficit hyperactivity disorder; Eosinophilia, unspecified; Gastroesophageal reflux disease without esophagitis; History of marijuana use; Major depression, single episode; Psoriasis; and Supervision of other normal pregnancy, antepartum on their problem list.  Patient reports Having recurrent nose bleeds.  Contractions: Not present. Vag. Bleeding: None.  Movement: Present. Denies leaking of fluid.   The following portions of the patient's history were reviewed and updated as appropriate: allergies, current medications, past family history, past medical history, past social history, past surgical history and problem list.   Objective:   Vitals:   03/29/23 1339  BP: 115/77  Pulse: 80  Weight: 132 lb (59.9 kg)    Fetal Status: Fetal Heart Rate (bpm): 157   Movement: Present     General:  Alert, oriented and cooperative. Patient is in no acute distress.  Skin: Skin is warm and dry. No rash noted.   Cardiovascular: Normal heart rate noted  Respiratory: Normal respiratory effort, no problems with respiration noted  Abdomen: Soft, gravid, appropriate for gestational age.  Pain/Pressure: Absent     Pelvic: Cervical exam deferred        Extremities: Normal range of motion.     Mental Status: Normal mood and affect. Normal behavior. Normal judgment and thought content.   Imaging: [place] MFM OB COMP + 14 WK  Result Date: 03/03/2023 ----------------------------------------------------------------------  OBSTETRICS REPORT                       (Signed Final 03/03/2023 02:45 pm) ---------------------------------------------------------------------- Patient Info  ID #:       [id number]                          D.O.B.:  [birth year] (31 yrs)  Name:       Mariah Sullivan                    Visit Date: 03/03/2023 01:44 pm ---------------------------------------------------------------------- Performed By  Attending:        Noralee Space MD        Ref. Address:     Faculty  Performed By:     Reinaldo Raddle            [facility]                                                             [facility]  Referred By:      Gigi GinPEGGY                    CONSTANT MD ---------------------------------------------------------------------- Orders  #  Description                           Code        Ordered By  1  US MFM OB COMP + 14 WK                76805.01    PEGGY CONSTANT ----------------------------------------------------------------------  #  Order #                     Accession #                Episode #  1  [id number]                   [phone]                 [id number] ---------------------------------------------------------------------- Indications  Encounter for antenatal screening for          Z36.3  malformations  [redacted] weeks gestation of pregnancy                Z3A.19  Neg AFP, Neg Horizon, LR NIPS ---------------------------------------------------------------------- Fetal Evaluation  Num Of Fetuses:         1  Fetal Heart Rate(bpm):  150  Cardiac Activity:       Observed  Presentation:           Breech  Placenta:               Anterior  P. Cord Insertion:      Visualized, central  Amniotic Fluid  AFI FV:      Within normal limits                              Largest Pocket(cm)                              2.96 ---------------------------------------------------------------------- Biometry  BPD:      44.2  mm     G. Age:  19w 3d         48  %    CI:        71.02   %    70 - 86                                                          FL/HC:      18.0   %    16.1 - 18.3  HC:      167.1  mm     G. Age:  19w 3d         40  %     HC/AC:      1.09        1.09 - 1.39  AC:      153.5  mm     G. Age:  20w 4d         80  %    FL/BPD:  67.9   %  FL:         30  mm     G. Age:  19w 2d         37  %    FL/AC:      19.5   %    20 - 24  HUM:      29.7  mm     G. Age:  19w 5d         59  %  CER:      20.3  mm     G. Age:  19w 4d         58  %  NFT:       5.1  mm  LV:        6.4  mm  CM:        4.2  mm  Est. FW:     318  gm    0 lb 11 oz      72  % ---------------------------------------------------------------------- OB History  Gravidity:    2         Term:   1        Prem:   0        SAB:   0  TOP:          0       Ectopic:  0        Living: 1 ---------------------------------------------------------------------- Gestational Age  U/S Today:     19w 5d                                        EDD:   07/23/23  Best:          19w 3d     Det. By:  U/S C R L  (12/29/22)    EDD:   07/25/23 ---------------------------------------------------------------------- Anatomy  Cranium:               Appears normal         Aortic Arch:            Not well visualized  Cavum:                 Appears normal         Ductal Arch:            Not well visualized  Ventricles:            Appears normal         Diaphragm:              Appears normal  Choroid Plexus:        Appears normal         Stomach:                Appears normal, left                                                                        sided  Cerebellum:            Appears normal         Abdomen:  Appears normal  Posterior Fossa:       Appears normal         Abdominal Wall:         Appears nml (cord                                                                        insert, abd wall)  Nuchal Fold:           Appears normal         Cord Vessels:           Appears normal (3                                                                        vessel cord)  Face:                  Appears normal         Kidneys:                Appear normal                         (orbits and profile)  Lips:                   Appears normal         Bladder:                Appears normal  Thoracic:              Appears normal         Spine:                  Appears normal  Heart:                 Not well visualized    Upper Extremities:      Appears normal  RVOT:                  Appears normal         Lower Extremities:      Appears normal  LVOT:                  Appears normal  Other:  Heels/feet and open hands/5th digits, VC, 3VV visualized. Nasal          bone, lenses, maxilla, mandible and falx visualized. Technically          difficult due to fetal position.Fetus appears to be a female. ---------------------------------------------------------------------- Cervix Uterus Adnexa  Cervix  Length:              3  cm.  Normal appearance by transabdominal scan  Uterus  No abnormality visualized.  Right Ovary  Not visualized.  Left Ovary  Size(cm)       1.9  x   1.15   x  0.73      Vol(ml): 0.84  Within normal  limits.  Cul De Sac  No free fluid seen.  Adnexa  No adnexal mass visualized ---------------------------------------------------------------------- Impression  G2 P1001. Patient is here for fetal anatomy scan. On cell-free  fetal DNA screening, the risks of aneuploidies are not  increased. MSAFP screening showed low risk for open-neural  tube defects.  Obstetric history is significant for a term vaginal delivery.  We performed fetal anatomy scan. No makers of  aneuploidies or fetal structural defects are seen. Fetal  biometry is consistent with her previously-established dates.  Amniotic fluid is normal and good fetal activity is seen.  Patient understands the limitations of ultrasound in detecting  fetal anomalies. ---------------------------------------------------------------------- Recommendations  -An appointment was made for her to return in 5 weeks for  completion of fetal anatomy. ----------------------------------------------------------------------                 Noralee Space, MD Electronically Signed Final  Report   03/03/2023 02:45 pm ----------------------------------------------------------------------   Assessment and Plan:  Pregnancy: G2P1001 at [redacted]w[redacted]d 1. Epistaxis/Nose bleeds Nasal spray prescribed. Recommended cool humidifier. Will continue to monitor.  - sodium chloride (OCEAN) 0.65 % SOLN nasal spray; Place 1 spray into both nostrils as needed for congestion.  Dispense: 30 mL; Refill: 2  2. [redacted] weeks gestation of pregnancy 3. Supervision of other normal pregnancy, antepartum Follow up anatomy scan already ordered. Preterm labor symptoms and general obstetric precautions including but not limited to vaginal bleeding, contractions, leaking of fluid and fetal movement were reviewed in detail with the patient. Please refer to After Visit Summary for other counseling recommendations.   Return in about 23 days (around 04/21/2023) for OB visit as scheduled (add GTT on 05/05/23 visit).  Future Appointments  Date Time Provider Department Center  04/07/2023  3:15 PM [facility] NURSE [place] [facility]  04/07/2023  3:30 PM [facility] US2 [facility]  04/21/2023  3:30 PM [place] Bing, MD [facility]  05/05/2023  3:30 PM  Bing, MD [facility]  05/19/2023  3:30 PM Reva Bores, MD [facility]  05/21/2023  3:00 PM [facility] None  05/21/2023  3:15 PM Creig Hines, MD [facility] None    Jaynie Collins, MD

## 2023-03-29 NOTE — Patient Instructions (Addendum)
Oral Glucose Tolerance Test During Pregnancy (to be done 05/05/23 visit) Why am I having this test? The oral glucose tolerance test (GTT) is done to check how your body processes blood sugar (glucose). This is one of several tests used to diagnose diabetes that develops during pregnancy (gestational diabetes mellitus). Gestational diabetes is a short-term form of diabetes that some women develop while they are pregnant. It usually occurs during the second or third trimester of pregnancy and goes away after delivery. Testing, or screening, for gestational diabetes usually occurs around 71 of pregnancy. This test may also be needed earlier if: You have a history of gestational diabetes. There is a history of giving birth to very large babies or of losing pregnancies (having stillbirths). You have signs and symptoms of diabetes, such as: Changes in your eyesight. Tingling or numbness in your hands or feet. Changes in hunger, thirst, and urination, and these are not explained by your pregnancy. What is being tested? This test measures the amount of glucose in your blood at different times during a period of 2 hours. This shows how well your body can process glucose.  You will have three separate blood draws. What kind of sample is taken?  Blood samples are required for this test. They are usually collected by inserting a needle into a blood vessel. How do I prepare for this test? For 3 days before your test, eat normally. Have plenty of carbohydrate-rich foods. You will be asked not to eat or drink anything other than water (to fast) starting 8-10 hours before the test. Tell a health care provider about: All medicines you are taking, including vitamins, herbs, eye drops, creams, and over-the-counter medicines. Any blood disorders you have. Any surgeries you have had. Any medical conditions you have. What happens during the test? First, your blood glucose will be measured. This is referred to as  your fasting blood glucose because you fasted before the test. Then, you will drink a glucose solution that contains a certain amount of glucose. Your blood glucose will be measured again 1 and 2 hours after you drink the solution. This test takes about 2 hours to complete. You will need to stay at the testing location during this time. During the testing period: Do not eat or drink anything other than the glucose solution. Do not exercise. Do not use any products that contain nicotine or tobacco, such as cigarettes, e-cigarettes, and chewing tobacco. These can affect your test results. If you need help quitting, ask your health care provider. The testing procedure may vary among health care providers and hospitals. How are the results reported? Your results will be reported as milligrams of glucose per deciliter of blood (mg/dL) or millimoles per liter (mmol/L). There is more than one source for screening and diagnosis reference values used to diagnose gestational diabetes. Your health care provider will compare your results to normal values that were established after testing a large group of people (reference values). Reference values may vary among labs and hospitals. For this test, reference values are: Fasting: 92 mg/dL 1 hour: 825 mg/dL  2 hour: 189 mg/dL   What do the results mean? Results below the reference values are considered normal. If one or more of your blood glucose levels are at or above the reference values, you will be diagnosed with gestational diabetes.  Talk with your health care provider about what your results mean. Questions to ask your health care provider Ask your health care provider, or the department that  is doing the test: When will my results be ready? How will I get my results? What are my treatment options? What other tests do I need? What are my next steps? Summary The oral glucose tolerance test (GTT) is one of several tests used to diagnose diabetes that  develops during pregnancy (gestational diabetes mellitus). Gestational diabetes is a short-term form of diabetes that some women develop while they are pregnant. You may also have this test if you have any symptoms or risk factors for this type of diabetes. Talk with your health care provider about what your results mean. This information is not intended to replace advice given to you by your health care provider. Make sure you discuss any questions you have with your health care provider. TDaP Vaccine Pregnancy Get the Whooping Cough Vaccine While You Are Pregnant (CDC)  It is important for women to get the whooping cough vaccine in the third trimester of each pregnancy. Vaccines are the best way to prevent this disease. There are 2 different whooping cough vaccines. Both vaccines combine protection against whooping cough, tetanus and diphtheria, but they are for different age groups: Tdap: for everyone 11 years or older, including pregnant women  DTaP: for children 2 months through 73 years of age  You need the whooping cough vaccine during each of your pregnancies The recommended time to get the shot is during your 27th through 36th week of pregnancy, preferably during the earlier part of this time period. The Centers for Disease Control and Prevention (CDC) recommends that pregnant women receive the whooping cough vaccine for adolescents and adults (called Tdap vaccine) during the third trimester of each pregnancy. The recommended time to get the shot is during your 27th through 36th week of pregnancy, preferably during the earlier part of this time period. This replaces the original recommendation that pregnant women get the vaccine only if they had not previously received it. The [employer] of [employer]stetricians and Gynecologists and the [employer] support this recommendation.  You should get the whooping cough vaccine while pregnant to pass protection to your  baby frame support disabled and/or not supported in this browser  Learn why Mariah Sullivan decided to get the whooping cough vaccine in her 3rd trimester of pregnancy and how her baby girl was born with some protection against the disease. Also available on YouTube. After receiving the whooping cough vaccine, your body will create protective antibodies (proteins produced by the body to fight off diseases) and pass some of them to your baby before birth. These antibodies provide your baby some short-term protection against whooping cough in early life. These antibodies can also protect your baby from some of the more serious complications that come along with whooping cough. Your protective antibodies are at their highest about 2 weeks after getting the vaccine, but it takes time to pass them to your baby. So the preferred time to get the whooping cough vaccine is early in your third trimester. The amount of whooping cough antibodies in your body decreases over time. That is why CDC recommends you get a whooping cough vaccine during each pregnancy. Doing so allows each of your babies to get the greatest number of protective antibodies from you. This means each of your babies will get the best protection possible against this disease.  Getting the whooping cough vaccine while pregnant is better than getting the vaccine after you give birth Whooping cough vaccination during pregnancy is ideal so your baby will have short-term protection  as soon as he is born. This early protection is important because your baby will not start getting his whooping cough vaccines until he is 2 months old. These first few months of life are when your baby is at greatest risk for catching whooping cough. This is also when he's at greatest risk for having severe, potentially life-threating complications from the infection. To avoid that gap in protection, it is best to get a whooping cough vaccine during pregnancy. You will then pass  protection to your baby before he is born. To continue protecting your baby, he should get whooping cough vaccines starting at 2 months old. You may never have gotten the Tdap vaccine before and did not get it during this pregnancy. If so, you should make sure to get the vaccine immediately after you give birth, before leaving the hospital or birthing center. It will take about 2 weeks before your body develops protection (antibodies) in response to the vaccine. Once you have protection from the vaccine, you are less likely to give whooping cough to your newborn while caring for him. But remember, your baby will still be at risk for catching whooping cough from others. A recent study looked to see how effective Tdap was at preventing whooping cough in babies whose mothers got the vaccine while pregnant or in the hospital after giving birth. The study found that getting Tdap between 27 through 36 weeks of pregnancy is 85% more effective at preventing whooping cough in babies younger than 2 months old. Blood tests cannot tell if you need a whooping cough vaccine There are no blood tests that can tell you if you have enough antibodies in your body to protect yourself or your baby against whooping cough. Even if you have been sick with whooping cough in the past or previously received the vaccine, you still should get the vaccine during each pregnancy. Breastfeeding may pass some protective antibodies onto your baby By breastfeeding, you may pass some antibodies you have made in response to the vaccine to your baby. When you get a whooping cough vaccine during your pregnancy, you will have antibodies in your breast milk that you can share with your baby as soon as your milk comes in. However, your baby will not get protective antibodies immediately if you wait to get the whooping cough vaccine until after delivering your baby. This is because it takes about 2 weeks for your body to create antibodies. Learn more  about the health benefits of breastfeeding.

## 2023-04-07 ENCOUNTER — Ambulatory Visit: Payer: Medicaid Other

## 2023-04-07 ENCOUNTER — Encounter: Payer: Self-pay | Admitting: *Deleted

## 2023-04-07 ENCOUNTER — Ambulatory Visit: Payer: Medicaid Other | Attending: Obstetrics and Gynecology

## 2023-04-07 DIAGNOSIS — Z3A24 24 weeks gestation of pregnancy: Secondary | ICD-10-CM | POA: Diagnosis not present

## 2023-04-07 DIAGNOSIS — Z362 Encounter for other antenatal screening follow-up: Secondary | ICD-10-CM | POA: Diagnosis not present

## 2023-04-21 ENCOUNTER — Telehealth (INDEPENDENT_AMBULATORY_CARE_PROVIDER_SITE_OTHER): Payer: Medicaid Other | Admitting: Obstetrics and Gynecology

## 2023-04-21 VITALS — Wt 137.0 lb

## 2023-04-21 DIAGNOSIS — O210 Mild hyperemesis gravidarum: Secondary | ICD-10-CM

## 2023-04-21 DIAGNOSIS — Z3A26 26 weeks gestation of pregnancy: Secondary | ICD-10-CM

## 2023-04-21 MED ORDER — ONDANSETRON HCL 4 MG PO TABS: 4.0000 mg | ORAL_TABLET | Freq: Three times a day (TID) | ORAL | 0 refills | Status: DC | PRN

## 2023-04-21 NOTE — Progress Notes (Signed)
    TELEHEALTH OBSTETRICS VISIT ENCOUNTER NOTE  Provider location: [facility]   Patient location: Home  I connected with Mariah Sullivan on 04/21/23 at  3:30 PM EDT by telephone at home and verified that I am speaking with the correct person using two identifiers. Of note, unable to do video encounter due to technical difficulties.    I discussed the limitations, risks, security and privacy concerns of performing an evaluation and management service by telephone and the availability of in person appointments. I also discussed with the patient that there may be a patient responsible charge related to this service. The patient expressed understanding and agreed to proceed.  Subjective:  Mariah Sullivan is a 32 y.o. G2P1001 at [redacted]w[redacted]d being followed for ongoing prenatal care.  She is currently monitored for the following issues for this low-risk pregnancy and has Gastroesophageal reflux disease without esophagitis; History of marijuana use; and Supervision of other normal pregnancy, antepartum on their problem list.  Patient reports  morning sickness . Reports fetal movement. Denies any contractions, bleeding or leaking of fluid.   The following portions of the patient's history were reviewed and updated as appropriate: allergies, current medications, past family history, past medical history, past social history, past surgical history and problem list.   Objective:  There were no vitals taken for this visit. General:  Alert, oriented and cooperative.   Mental Status: Normal mood and affect perceived. Normal judgment and thought content.  Rest of physical exam deferred due to type of encounter  Assessment and Plan:  Pregnancy: G2P1001 at [redacted]w[redacted]d 1. Morning sickness Recommend eating a snack or something if unable to eat breakfast when she wakes up. Weight good at 17lbs total weight gain and no loss from last visit. PRN Zofran sent in  2. [redacted] weeks gestation of  pregnancy 28wk labs next visit  Preterm labor symptoms and general obstetric precautions including but not limited to vaginal bleeding, contractions, leaking of fluid and fetal movement were reviewed in detail with the patient.  I discussed the assessment and treatment plan with the patient. The patient was provided an opportunity to ask questions and all were answered. The patient agreed with the plan and demonstrated an understanding of the instructions. The patient was advised to call back or seek an in-person office evaluation/go to [facility] at [facility] for any urgent or concerning symptoms. Please refer to After Visit Summary for other counseling recommendations.   I provided 10 minutes of non-face-to-face time during this encounter.  No follow-ups on file.  Future Appointments  Date Time Provider Department Center  05/05/2023  3:30 PM [place] Bing, MD [facility]  05/19/2023  3:30 PM Reva Bores, MD [facility]  05/21/2023  3:00 PM [facility] None  05/21/2023  3:15 PM Creig Hines, MD [facility] None    [place] Bing, MD [facility]

## 2023-04-21 NOTE — Progress Notes (Signed)
CC: morning sickness again x 1 month   Pt unable to check

## 2023-05-05 ENCOUNTER — Ambulatory Visit (INDEPENDENT_AMBULATORY_CARE_PROVIDER_SITE_OTHER): Payer: Medicaid Other | Admitting: Obstetrics and Gynecology

## 2023-05-05 ENCOUNTER — Other Ambulatory Visit: Payer: Medicaid Other

## 2023-05-05 ENCOUNTER — Encounter: Payer: Medicaid Other | Admitting: Obstetrics and Gynecology

## 2023-05-05 VITALS — BP 126/73 | HR 99 | Wt 143.0 lb

## 2023-05-05 DIAGNOSIS — Z3480 Encounter for supervision of other normal pregnancy, unspecified trimester: Secondary | ICD-10-CM | POA: Diagnosis not present

## 2023-05-05 DIAGNOSIS — O99019 Anemia complicating pregnancy, unspecified trimester: Secondary | ICD-10-CM | POA: Diagnosis not present

## 2023-05-05 DIAGNOSIS — Z3A28 28 weeks gestation of pregnancy: Secondary | ICD-10-CM

## 2023-05-05 NOTE — Progress Notes (Signed)
CC: Morning sickness still having, not taking zofran

## 2023-05-05 NOTE — Progress Notes (Signed)
   PRENATAL VISIT NOTE  Subjective:  Mariah Sullivan is a 32 y.o. G2P1001 at [redacted]w[redacted]d being seen today for ongoing prenatal care.  She is currently monitored for the following issues for this low-risk pregnancy and has Gastroesophageal reflux disease without esophagitis; History of marijuana use; and Supervision of other normal pregnancy, antepartum on their problem list.  Patient reports  nausea throughout the day; hasn't started the zofran yet b/c she was worried it may cause sleepiness .  Contractions: Not present. Vag. Bleeding: None.  Movement: Present. Denies leaking of fluid.   The following portions of the patient's history were reviewed and updated as appropriate: allergies, current medications, past family history, past medical history, past social history, past surgical history and problem list.   Objective:   Vitals:   05/05/23 0921  BP: 126/73  Pulse: 99  Weight: 143 lb (64.9 kg)    Fetal Status: Fetal Heart Rate (bpm): 139 Fundal Height: 28 cm Movement: Present     General:  Alert, oriented and cooperative. Patient is in no acute distress.  Skin: Skin is warm and dry. No rash noted.   Cardiovascular: Normal heart rate noted  Respiratory: Normal respiratory effort, no problems with respiration noted  Abdomen: Soft, gravid, appropriate for gestational age.  Pain/Pressure: Present     Pelvic: Cervical exam deferred        Extremities: Normal range of motion.  Edema: None  Mental Status: Normal mood and affect. Normal behavior. Normal judgment and thought content.   Assessment and Plan:  Pregnancy: G2P1001 at [redacted]w[redacted]d 1. [redacted] weeks gestation of pregnancy 28wk labs today; weight gain good Bonjesta sample given to her and d/w her most common SE of the zofran is constipation and bonjesta helpful b/c it can be daily as maintenance with PRN zofran usage.   Preterm labor symptoms and general obstetric precautions including but not limited to vaginal bleeding, contractions, leaking of  fluid and fetal movement were reviewed in detail with the patient. Please refer to After Visit Summary for other counseling recommendations.   Return in about 2 weeks (around 05/19/2023) for 2-3wk rob, in person or virtual, md or app.  Future Appointments  Date Time Provider Department Center  05/19/2023  3:30 PM Reva Bores, MD [facility]  05/21/2023  3:00 PM [facility] None  05/21/2023  3:15 PM Creig Hines, MD [facility] None    [place] Bing, MD

## 2023-05-06 LAB — CBC
Hematocrit: 34.9 % (ref 34.0–46.6)
Hemoglobin: 11.7 g/dL (ref 11.1–15.9)
MCH: 33.5 pg — ABNORMAL HIGH (ref 26.6–33.0)
MCHC: 33.5 g/dL (ref 31.5–35.7)
MCV: 100 fL — ABNORMAL HIGH (ref 79–97)
Platelets: 214 10*3/uL (ref 150–450)
RBC: 3.49 x10E6/uL — ABNORMAL LOW (ref 3.77–5.28)
RDW: 12.2 % (ref 11.7–15.4)
WBC: 9.6 10*3/uL (ref 3.4–10.8)

## 2023-05-06 LAB — GLUCOSE TOLERANCE, 2 HOURS W/ 1HR
Glucose, 1 hour: 156 mg/dL (ref 70–179)
Glucose, 2 hour: 118 mg/dL (ref 70–152)
Glucose, Fasting: 83 mg/dL (ref 70–91)

## 2023-05-06 LAB — RPR: RPR Ser Ql: NONREACTIVE

## 2023-05-06 LAB — HIV ANTIBODY (ROUTINE TESTING W REFLEX): HIV Screen 4th Generation wRfx: NONREACTIVE

## 2023-05-07 ENCOUNTER — Encounter: Payer: Self-pay | Admitting: Obstetrics and Gynecology

## 2023-05-07 DIAGNOSIS — R718 Other abnormality of red blood cells: Secondary | ICD-10-CM | POA: Insufficient documentation

## 2023-05-11 ENCOUNTER — Other Ambulatory Visit: Payer: Self-pay | Admitting: *Deleted

## 2023-05-11 ENCOUNTER — Encounter: Payer: Self-pay | Admitting: Obstetrics and Gynecology

## 2023-05-11 MED ORDER — DOXYLAMINE-PYRIDOXINE 10-10 MG PO TBEC: 2.0000 | DELAYED_RELEASE_TABLET | Freq: Every day | ORAL | 5 refills | Status: DC

## 2023-05-17 LAB — ANEMIA PROFILE B
Ferritin: 21 ng/mL (ref 15–150)
Folate: 16.7 ng/mL (ref >3.0)
Iron Saturation: 18 % (ref 15–55)
Iron: 81 ug/dL (ref 27–159)
Total Iron Binding Capacity: 446 ug/dL (ref 250–450)
UIBC: 365 ug/dL (ref 131–425)
Vitamin B-12: 608 pg/mL (ref 232–1245)

## 2023-05-17 LAB — SPECIMEN STATUS REPORT

## 2023-05-19 ENCOUNTER — Encounter: Payer: Self-pay | Admitting: Family Medicine

## 2023-05-19 ENCOUNTER — Ambulatory Visit (INDEPENDENT_AMBULATORY_CARE_PROVIDER_SITE_OTHER): Payer: Medicaid Other | Admitting: Family Medicine

## 2023-05-19 VITALS — BP 113/77 | HR 91 | Wt 142.0 lb

## 2023-05-19 DIAGNOSIS — Z3480 Encounter for supervision of other normal pregnancy, unspecified trimester: Secondary | ICD-10-CM

## 2023-05-19 NOTE — Progress Notes (Signed)
   PRENATAL VISIT NOTE  Subjective:  Mariah Sullivan is a 32 y.o. G2P1001 at [redacted]w[redacted]d being seen today for ongoing prenatal care.  She is currently monitored for the following issues for this high-risk pregnancy and has Gastroesophageal reflux disease without esophagitis; History of marijuana use; Supervision of other normal pregnancy, antepartum; and Elevated MCV on their problem list.  Patient reports no complaints.  Contractions: Not present. Vag. Bleeding: None.  Movement: Present. Denies leaking of fluid.   The following portions of the patient's history were reviewed and updated as appropriate: allergies, current medications, past family history, past medical history, past social history, past surgical history and problem list.   Objective:   Vitals:   05/19/23 1602  BP: 113/77  Pulse: 91  Weight: 142 lb (64.4 kg)    Fetal Status: Fetal Heart Rate (bpm): 144 Fundal Height: 31 cm Movement: Present  Presentation: Vertex  General:  Alert, oriented and cooperative. Patient is in no acute distress.  Skin: Skin is warm and dry. No rash noted.   Cardiovascular: Normal heart rate noted  Respiratory: Normal respiratory effort, no problems with respiration noted  Abdomen: Soft, gravid, appropriate for gestational age.  Pain/Pressure: Present     Pelvic: Cervical exam deferred        Extremities: Normal range of motion.  Edema: None  Mental Status: Normal mood and affect. Normal behavior. Normal judgment and thought content.   Assessment and Plan:  Pregnancy: G2P1001 at [redacted]w[redacted]d 1. Supervision of other normal pregnancy, antepartum Continue routine prenatal care.   Preterm labor symptoms and general obstetric precautions including but not limited to vaginal bleeding, contractions, leaking of fluid and fetal movement were reviewed in detail with the patient. Please refer to After Visit Summary for other counseling recommendations.   Return in 2 weeks (on 06/02/2023).  Future Appointments   Date Time Provider Department Center  05/21/2023  3:00 PM [facility] None  05/21/2023  3:15 PM Creig Hines, MD [facility] None  06/02/2023  3:30 PM [place] Bing, MD [facility]  06/16/2023  3:30 PM Reva Bores, MD [facility]    Reva Bores, MD

## 2023-05-19 NOTE — Progress Notes (Signed)
ROB   Notes much relief of nausea with Medication.

## 2023-05-21 ENCOUNTER — Encounter: Payer: Self-pay | Admitting: Oncology

## 2023-05-21 ENCOUNTER — Inpatient Hospital Stay: Payer: Medicaid Other | Admitting: Oncology

## 2023-05-21 ENCOUNTER — Inpatient Hospital Stay: Payer: Medicaid Other | Attending: Oncology

## 2023-06-02 ENCOUNTER — Ambulatory Visit (INDEPENDENT_AMBULATORY_CARE_PROVIDER_SITE_OTHER): Payer: Medicaid Other | Admitting: Obstetrics and Gynecology

## 2023-06-02 VITALS — BP 122/82 | HR 111 | Wt 147.0 lb

## 2023-06-02 DIAGNOSIS — Z3480 Encounter for supervision of other normal pregnancy, unspecified trimester: Secondary | ICD-10-CM

## 2023-06-02 DIAGNOSIS — Z3A32 32 weeks gestation of pregnancy: Secondary | ICD-10-CM

## 2023-06-02 NOTE — Progress Notes (Signed)
CC: routine ob, does notice some cramping

## 2023-06-02 NOTE — Progress Notes (Addendum)
   PRENATAL VISIT NOTE  Subjective:  Mariah Sullivan is a 32 y.o. G2P1001 at [redacted]w[redacted]d being seen today for ongoing prenatal care.  She is currently monitored for the following issues for this low-risk pregnancy and has Gastroesophageal reflux disease without esophagitis; History of marijuana use; Supervision of other normal pregnancy, antepartum; and Elevated MCV on their problem list.  Patient reports no complaints.  Contractions: Not present. Vag. Bleeding: None.  Movement: Present. Denies leaking of fluid.   The following portions of the patient's history were reviewed and updated as appropriate: allergies, current medications, past family history, past medical history, past social history, past surgical history and problem list.   Objective:   Vitals:   06/02/23 1537  BP: 122/82  Pulse: (!) 111  Weight: 147 lb (66.7 kg)    Fetal Status: Fetal Heart Rate (bpm): 145 Fundal Height: 32 cm Movement: Present     General:  Alert, oriented and cooperative. Patient is in no acute distress.  Skin: Skin is warm and dry. No rash noted.   Cardiovascular: Normal heart rate noted  Respiratory: Normal respiratory effort, no problems with respiration noted  Abdomen: Soft, gravid, appropriate for gestational age.  Pain/Pressure: Present     Pelvic: Cervical exam deferred        Extremities: Normal range of motion.  Edema: None  Mental Status: Normal mood and affect. Normal behavior. Normal judgment and thought content.   Assessment and Plan:  Pregnancy: G2P1001 at [redacted]w[redacted]d 1. Supervision of other normal pregnancy, antepartum Routine care. Confirms on PO iron and folic acid  2. [redacted] weeks gestation of pregnancy  Preterm labor symptoms and general obstetric precautions including but not limited to vaginal bleeding, contractions, leaking of fluid and fetal movement were reviewed in detail with the patient. Please refer to After Visit Summary for other counseling recommendations.   Return in about 2  weeks (around 06/16/2023) for 2-3wks, low risk ob, md or app, in person.  Future Appointments  Date Time Provider Department Center  06/16/2023  3:30 PM Reva Bores, MD [facility]  06/29/2023  3:30 PM [place] Large, Jethro Bastos, MD [facility]  07/06/2023  3:30 PM [place] Large, Jethro Bastos, MD [facility]  07/14/2023  3:30 PM Reva Bores, MD [facility]  07/21/2023  3:30 PM Reva Bores, MD [facility]    Francis Bing, MD

## 2023-06-11 IMAGING — CT CT HEAD W/O CM
3 of 4 series · 13 of 47 positions shown, 15 images · non-contrast
Comparison: None.

CLINICAL DATA: Palpable lump on forehead, left lower scalp swelling
and tenderness



[Series 2: axial st head 5.00 ax · axial · 0.33mm/px · z∈[-540,-436]mm · 7 of 29 slices shown, 9 images]
[im 4/29  brain]
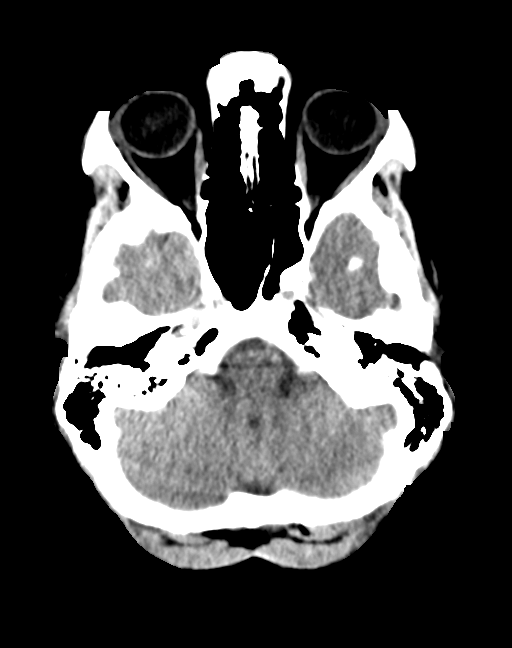
[im 4/29  bone]
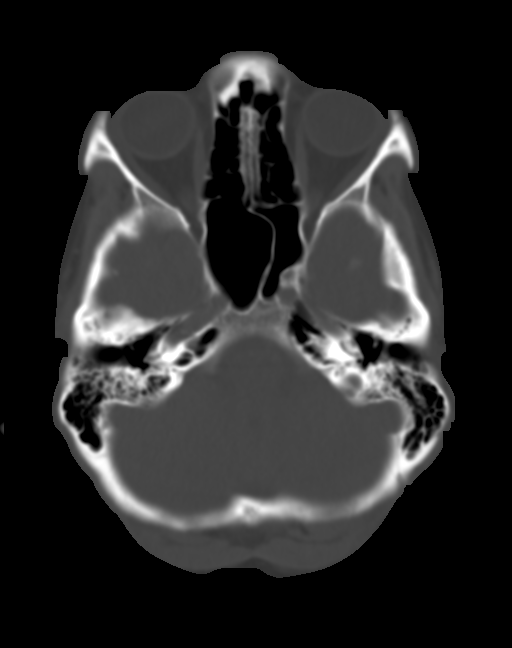
[im 8/29  brain]
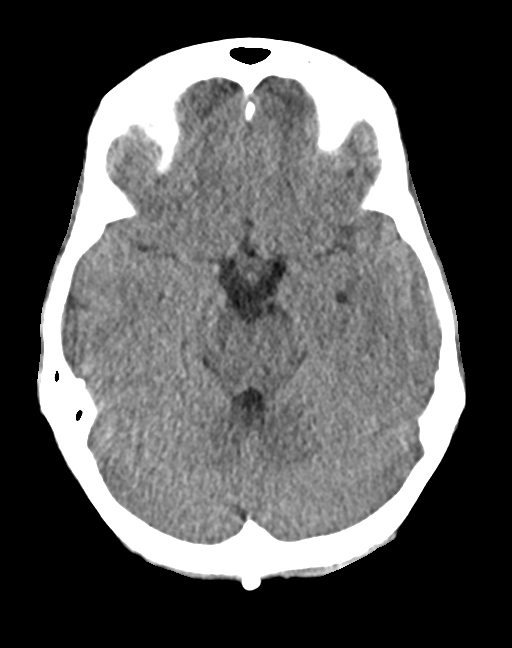
[im 11/29  brain]
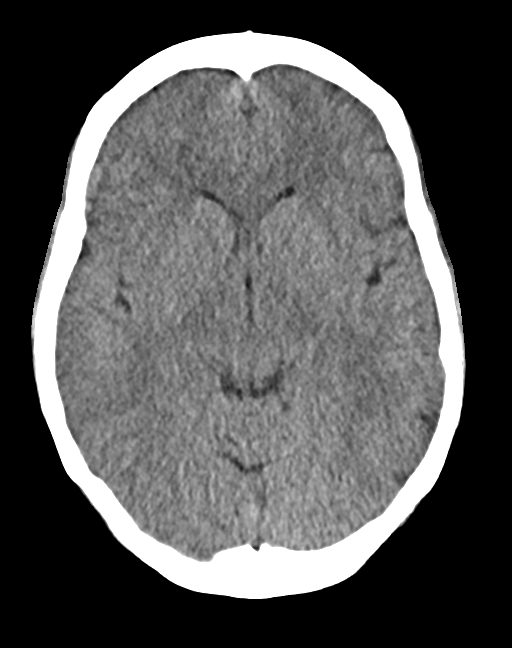
[im 15/29  brain]
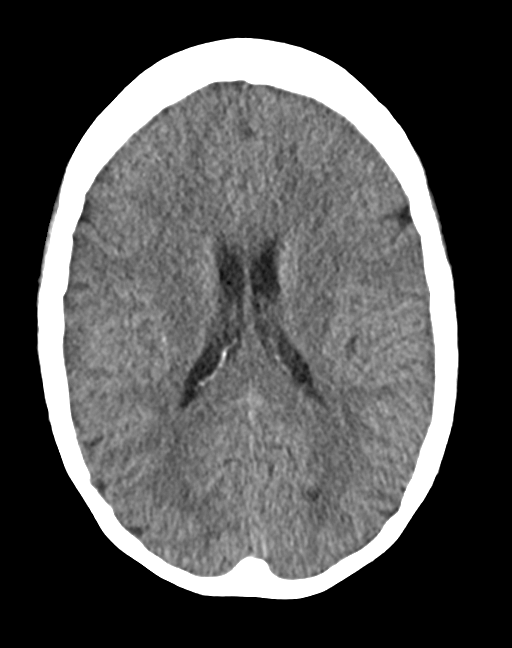
[im 18/29  brain]
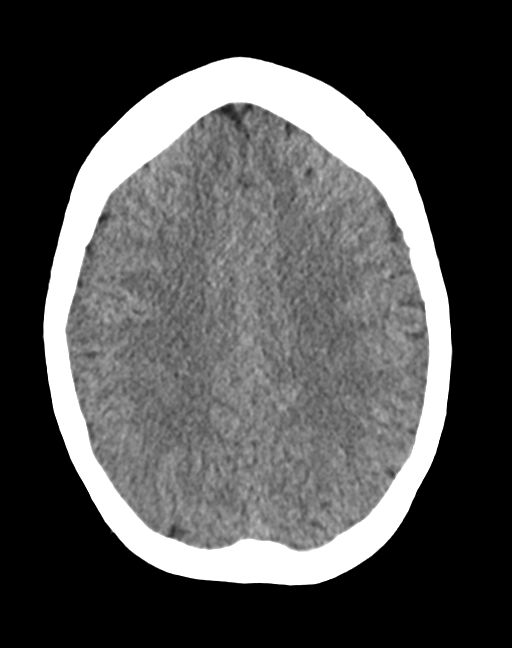
[im 18/29  bone]
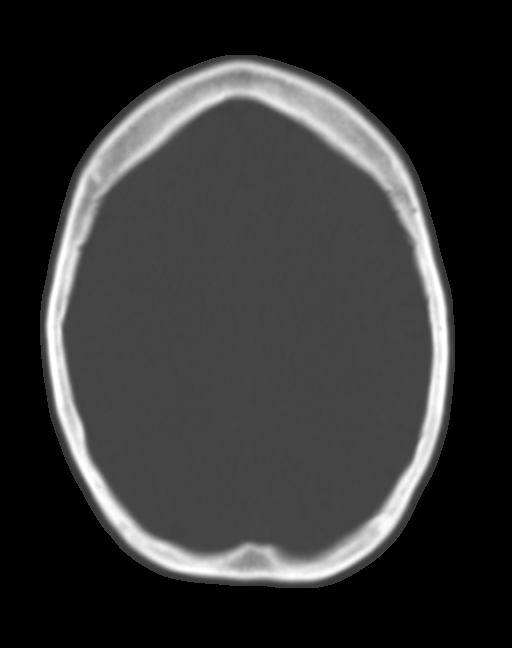
[im 22/29  brain]
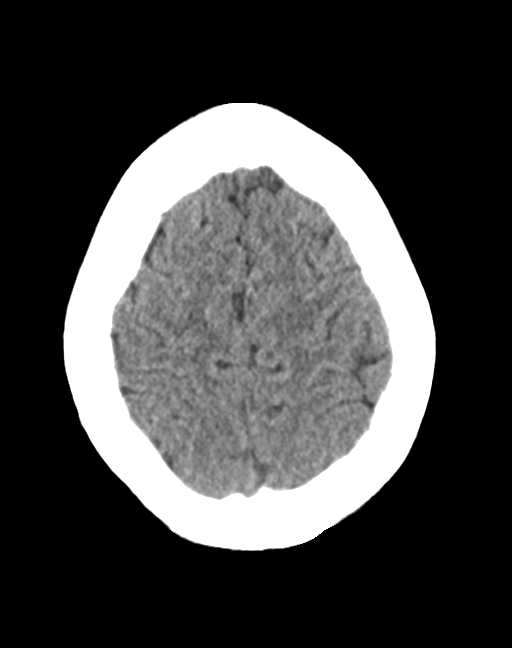
[im 25/29  brain]
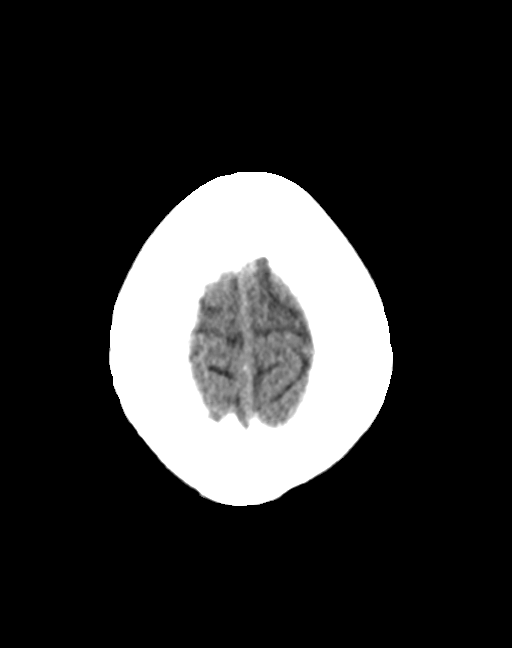

[Series 6: coronals head 3.00 cor · coronal · 0.29mm/px · 3 of 68 slices shown]
[im 23/68  brain]
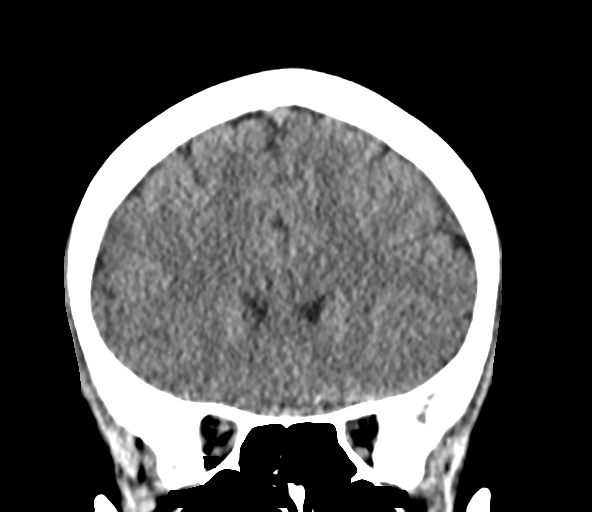
[im 30/68  brain]
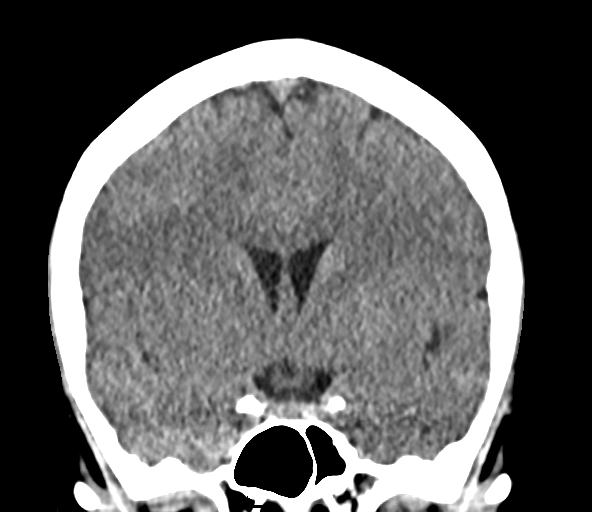
[im 38/68  brain]
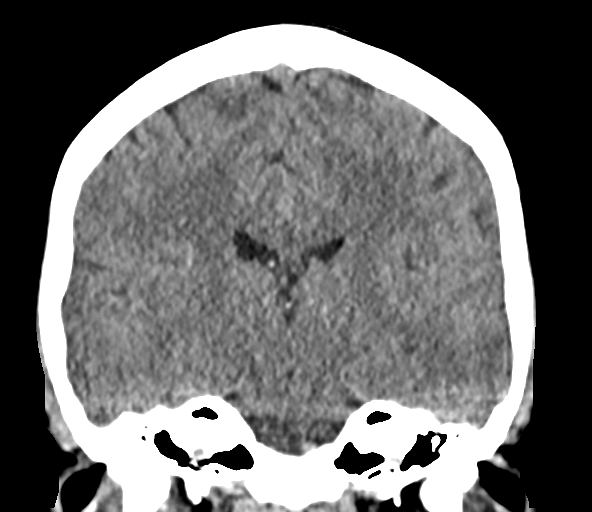

[Series 8: sagittals head 3.00 sag · sagittal · 0.29mm/px · 3 of 56 slices shown]
[im 19/56  brain]
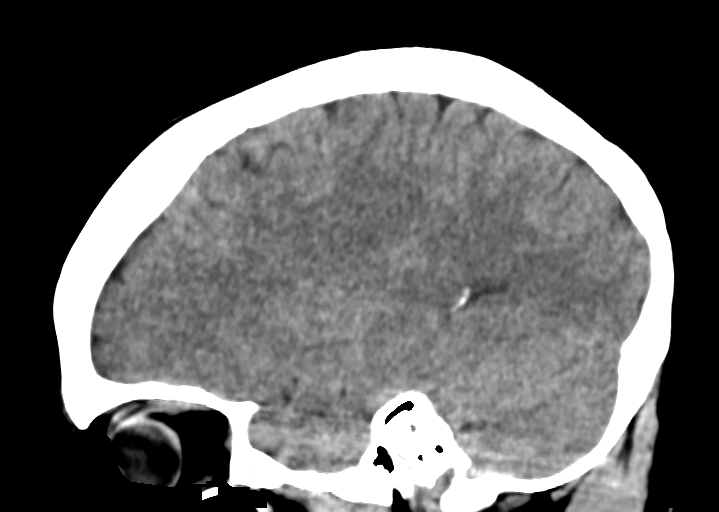
[im 28/56  brain]
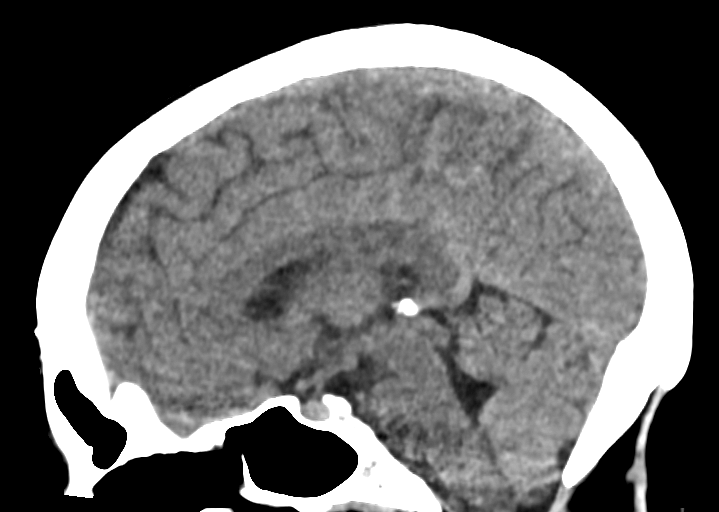
[im 37/56  brain]
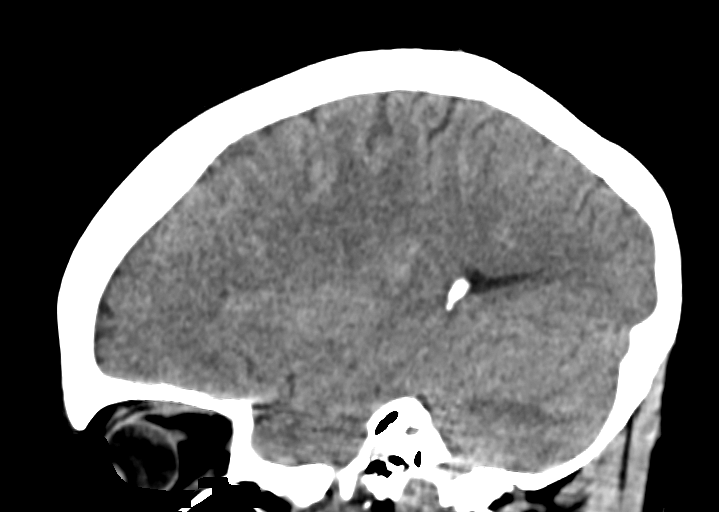

[13 of 47 positions shown; findings below may reference images not displayed]

FINDINGS: Brain: No evidence of acute infarction, hemorrhage, cerebral edema,
mass, mass effect, or midline shift. No hydrocephalus or extra-axial
fluid collection.

Vascular: No hyperdense vessel.

Skull: Normal. Negative for fracture or focal lesion.

Sinuses/Orbits: No acute finding. The mastoids are well aerated.

Other: A radiopaque marker is noted overlying the left occipital
scalp. No significant abnormality is seen subjacent to the marker.
The structures subjacent to the marker include normal skin,
subcutaneous fat, and neck musculature, including the trapezius and
semispinalis capitis muscles.
IMPRESSION: IMPRESSION
1.  No acute intracranial process.

2. No structural abnormality subjacent to the radiopaque marker in
the left neck. The patient may be palpating the left trapezius or
semispinalis capitis muscles.

## 2023-06-16 ENCOUNTER — Ambulatory Visit (INDEPENDENT_AMBULATORY_CARE_PROVIDER_SITE_OTHER): Payer: Medicaid Other | Admitting: Family Medicine

## 2023-06-16 VITALS — BP 145/83 | HR 102 | Wt 148.2 lb

## 2023-06-16 DIAGNOSIS — O163 Unspecified maternal hypertension, third trimester: Secondary | ICD-10-CM | POA: Diagnosis not present

## 2023-06-16 DIAGNOSIS — Z3480 Encounter for supervision of other normal pregnancy, unspecified trimester: Secondary | ICD-10-CM

## 2023-06-16 NOTE — Progress Notes (Signed)
CC: Noticing some numbness in bilateral hands  Also noticing BP higher today, Pt stating that she is starting to swell

## 2023-06-17 LAB — COMPREHENSIVE METABOLIC PANEL WITH GFR
ALT: 9 IU/L (ref 0–32)
AST: 19 IU/L (ref 0–40)
Albumin: 3.1 g/dL — ABNORMAL LOW (ref 3.9–4.9)
Alkaline Phosphatase: 159 IU/L — ABNORMAL HIGH (ref 44–121)
BUN/Creatinine Ratio: 13 (ref 9–23)
BUN: 5 mg/dL — ABNORMAL LOW (ref 6–20)
Bilirubin Total: 0.2 mg/dL (ref 0.0–1.2)
CO2: 20 mmol/L (ref 20–29)
Calcium: 8.4 mg/dL — ABNORMAL LOW (ref 8.7–10.2)
Chloride: 106 mmol/L (ref 96–106)
Creatinine, Ser: 0.39 mg/dL — ABNORMAL LOW (ref 0.57–1.00)
Globulin, Total: 2.1 g/dL (ref 1.5–4.5)
Glucose: 92 mg/dL (ref 70–99)
Potassium: 3.9 mmol/L (ref 3.5–5.2)
Sodium: 138 mmol/L (ref 134–144)
Total Protein: 5.2 g/dL — ABNORMAL LOW (ref 6.0–8.5)
eGFR: 135 mL/min/1.73 (ref >59)

## 2023-06-17 LAB — CBC
Hematocrit: 37.6 % (ref 34.0–46.6)
Hemoglobin: 12.6 g/dL (ref 11.1–15.9)
MCH: 33.9 pg — ABNORMAL HIGH (ref 26.6–33.0)
MCHC: 33.5 g/dL (ref 31.5–35.7)
MCV: 101 fL — ABNORMAL HIGH (ref 79–97)
Platelets: 201 x10E3/uL (ref 150–450)
RBC: 3.72 x10E6/uL — ABNORMAL LOW (ref 3.77–5.28)
RDW: 12.2 % (ref 11.7–15.4)
WBC: 9.5 x10E3/uL (ref 3.4–10.8)

## 2023-06-17 LAB — PROTEIN / CREATININE RATIO, URINE
Creatinine, Urine: 123.7 mg/dL
Protein, Ur: 21.7 mg/dL
Protein/Creat Ratio: 175 mg/g{creat} (ref 0–200)

## 2023-06-17 NOTE — Progress Notes (Signed)
   PRENATAL VISIT NOTE  Subjective:  Mariah Sullivan is a 32 y.o. G2P1001 at [redacted]w[redacted]d being seen today for ongoing prenatal care.  She is currently monitored for the following issues for this low-risk pregnancy and has Gastroesophageal reflux disease without esophagitis; History of marijuana use; Supervision of other normal pregnancy, antepartum; and Elevated MCV on their problem list.  Patient reports no complaints.  Contractions: Not present. Vag. Bleeding: None.  Movement: Present. Denies leaking of fluid.   The following portions of the patient's history were reviewed and updated as appropriate: allergies, current medications, past family history, past medical history, past social history, past surgical history and problem list.   Objective:   Vitals:   06/16/23 1551  BP: (!) 145/83  Pulse: (!) 102  Weight: 148 lb 3.2 oz (67.2 kg)    Fetal Status: Fetal Heart Rate (bpm): 143 Fundal Height: 32 cm Movement: Present     General:  Alert, oriented and cooperative. Patient is in no acute distress.  Skin: Skin is warm and dry. No rash noted.   Cardiovascular: Normal heart rate noted  Respiratory: Normal respiratory effort, no problems with respiration noted  Abdomen: Soft, gravid, appropriate for gestational age.  Pain/Pressure: Absent     Pelvic: Cervical exam deferred        Extremities: Normal range of motion.  Edema: Trace  Mental Status: Normal mood and affect. Normal behavior. Normal judgment and thought content.   Assessment and Plan:  Pregnancy: G2P1001 at [redacted]w[redacted]d 1. Supervision of other normal pregnancy, antepartum Continue routine prenatal care.  2. Elevated blood pressure affecting pregnancy in third trimester, antepartum Labs today, monitor BP--recheck in a few days - CBC - Comprehensive metabolic panel - Protein / creatinine ratio, urine  Preterm labor symptoms and general obstetric precautions including but not limited to vaginal bleeding, contractions, leaking of fluid  and fetal movement were reviewed in detail with the patient. Please refer to After Visit Summary for other counseling recommendations.   No follow-ups on file.  Future Appointments  Date Time Provider Department Center  06/29/2023  3:30 PM Tereso Newcomer, MD [facility]  07/06/2023  3:30 PM Anyanwu, Jethro Bastos, MD [facility]  07/14/2023  3:30 PM Reva Bores, MD [facility]  07/21/2023  3:30 PM Reva Bores, MD [facility]    Reva Bores, MD

## 2023-06-23 ENCOUNTER — Ambulatory Visit (INDEPENDENT_AMBULATORY_CARE_PROVIDER_SITE_OTHER): Payer: Medicaid Other

## 2023-06-23 ENCOUNTER — Encounter: Payer: Self-pay | Admitting: Obstetrics and Gynecology

## 2023-06-23 VITALS — BP 122/84 | HR 76

## 2023-06-23 DIAGNOSIS — Z0130 Encounter for examination of blood pressure without abnormal findings: Secondary | ICD-10-CM

## 2023-06-23 DIAGNOSIS — R030 Elevated blood-pressure reading, without diagnosis of hypertension: Secondary | ICD-10-CM

## 2023-06-23 NOTE — Progress Notes (Signed)
Subjective:  Mariah Sullivan is a 32 y.o. female here for BP check from last OV   Hypertension ROS: Patient denies any headaches, visual symptoms, RUQ/epigastric pain or other concerning symptoms.  Objective:  LMP  (LMP Unknown)   Appearance alert, well appearing, and in no distress. General exam BP noted to be 122/84 today in office.    Assessment:   Blood Pressure improved.   Plan:  Current treatment plan is effective, no change in therapy.Marland Kitchen

## 2023-06-29 ENCOUNTER — Other Ambulatory Visit (HOSPITAL_COMMUNITY)
Admission: RE | Admit: 2023-06-29 | Discharge: 2023-06-29 | Disposition: A | Discharge status: Home / Self Care | Payer: Medicaid Other | Source: Ambulatory Visit | Attending: Obstetrics & Gynecology | Admitting: Obstetrics & Gynecology

## 2023-06-29 ENCOUNTER — Encounter: Payer: Self-pay | Admitting: Obstetrics & Gynecology

## 2023-06-29 ENCOUNTER — Ambulatory Visit (INDEPENDENT_AMBULATORY_CARE_PROVIDER_SITE_OTHER): Payer: Medicaid Other | Admitting: Obstetrics & Gynecology

## 2023-06-29 VITALS — BP 127/89 | HR 80 | Wt 151.0 lb

## 2023-06-29 DIAGNOSIS — Z3480 Encounter for supervision of other normal pregnancy, unspecified trimester: Secondary | ICD-10-CM

## 2023-06-29 DIAGNOSIS — Z3483 Encounter for supervision of other normal pregnancy, third trimester: Secondary | ICD-10-CM | POA: Diagnosis not present

## 2023-06-29 DIAGNOSIS — Z3A36 36 weeks gestation of pregnancy: Secondary | ICD-10-CM

## 2023-06-29 NOTE — Patient Instructions (Signed)
Return to office for any scheduled appointments. Call the office or go to the MAU at [facility] if: You begin to have strong, frequent contractions Your water breaks.  Sometimes it is a big gush of fluid, sometimes it is just a trickle that keeps getting your underwear wet or running down your legs You have vaginal bleeding.  It is normal to have a small amount of spotting if your cervix was checked.  You do not feel your baby moving like normal.  If you do not, get something to eat and drink and lay down and focus on feeling your baby move.   If your baby is still not moving like normal, you should call the office or go to MAU. Any other obstetric concerns.  

## 2023-06-29 NOTE — Progress Notes (Signed)
   PRENATAL VISIT NOTE  Subjective:  Mariah Sullivan is a 32 y.o. G2P1001 at [redacted]w[redacted]d being seen today for ongoing prenatal care.  She is currently monitored for the following issues for this low-risk pregnancy and has Gastroesophageal reflux disease without esophagitis; History of marijuana use; Supervision of other normal pregnancy, antepartum; and Elevated MCV on their problem list.  Patient reports occasional contractions.  Contractions: Irritability. Vag. Bleeding: None.  Movement: Present. Denies leaking of fluid.   The following portions of the patient's history were reviewed and updated as appropriate: allergies, current medications, past family history, past medical history, past social history, past surgical history and problem list.   Objective:   Vitals:   06/29/23 1550  BP: 127/89  Pulse: 80  Weight: 151 lb (68.5 kg)    Fetal Status: Fetal Heart Rate (bpm): 150 Fundal Height: 33 cm Movement: Present  Presentation: Vertex  General:  Alert, oriented and cooperative. Patient is in no acute distress.  Skin: Skin is warm and dry. No rash noted.   Cardiovascular: Normal heart rate noted  Respiratory: Normal respiratory effort, no problems with respiration noted  Abdomen: Soft, gravid, appropriate for gestational age.  Pain/Pressure: Present     Pelvic: Cervical exam performed in the presence of a chaperone Dilation: 1.5 Effacement (%): 50 Station: -2, cultures done.  Extremities: Normal range of motion.  Edema: Trace  Mental Status: Normal mood and affect. Normal behavior. Normal judgment and thought content.   Assessment and Plan:  Pregnancy: G2P1001 at [redacted]w[redacted]d 1. [redacted] weeks gestation of pregnancy 2. Supervision of other normal pregnancy, antepartum Pelvic cultures done, will follow up results and manage accordingly. - Strep Gp B NAA - Cervicovaginal ancillary only Preterm labor symptoms and general obstetric precautions including but not limited to vaginal bleeding, contractions,  leaking of fluid and fetal movement were reviewed in detail with the patient. Please refer to After Visit Summary for other counseling recommendations.   Return in about 1 week (around 07/06/2023) for OFFICE OB VISIT (MD only).  Future Appointments  Date Time Provider Department Center  07/06/2023  3:30 PM Tereso Newcomer, MD [facility]  07/14/2023  3:30 PM Reva Bores, MD [facility]  07/21/2023  3:30 PM Reva Bores, MD [facility]    Jaynie Collins, MD

## 2023-07-01 LAB — STREP GP B NAA: Strep Gp B NAA: NEGATIVE

## 2023-07-01 LAB — CERVICOVAGINAL ANCILLARY ONLY
Chlamydia: NEGATIVE
Comment: NEGATIVE
Comment: NORMAL
Neisseria Gonorrhea: NEGATIVE

## 2023-07-06 ENCOUNTER — Ambulatory Visit: Payer: Medicaid Other | Admitting: Obstetrics & Gynecology

## 2023-07-06 ENCOUNTER — Encounter: Payer: Self-pay | Admitting: Obstetrics & Gynecology

## 2023-07-06 VITALS — BP 127/86 | HR 88 | Wt 150.6 lb

## 2023-07-06 DIAGNOSIS — Z3480 Encounter for supervision of other normal pregnancy, unspecified trimester: Secondary | ICD-10-CM

## 2023-07-06 DIAGNOSIS — Z3A37 37 weeks gestation of pregnancy: Secondary | ICD-10-CM

## 2023-07-06 NOTE — Patient Instructions (Signed)

## 2023-07-06 NOTE — Progress Notes (Signed)
   PRENATAL VISIT NOTE  Subjective:  Mariah Sullivan is a 32 y.o. G2P1001 at [redacted]w[redacted]d being seen today for ongoing prenatal care.  She is currently monitored for the following issues for this low-risk pregnancy and has Gastroesophageal reflux disease without esophagitis; History of marijuana use; Supervision of other normal pregnancy, antepartum; and Elevated MCV on their problem list.  Patient reports no complaints.  Contractions: Irregular. Vag. Bleeding: None.  Movement: Present. Denies leaking of fluid.   The following portions of the patient's history were reviewed and updated as appropriate: allergies, current medications, past family history, past medical history, past social history, past surgical history and problem list.   Objective:   Vitals:   07/06/23 1538  BP: 127/86  Pulse: 88  Weight: 150 lb 9.6 oz (68.3 kg)    Fetal Status: Fetal Heart Rate (bpm): 133 Fundal Height: 37 cm Movement: Present     General:  Alert, oriented and cooperative. Patient is in no acute distress.  Skin: Skin is warm and dry. No rash noted.   Cardiovascular: Normal heart rate noted  Respiratory: Normal respiratory effort, no problems with respiration noted  Abdomen: Soft, gravid, appropriate for gestational age.  Pain/Pressure: Present (Cramping)     Pelvic: Cervical exam deferred        Extremities: Normal range of motion.  Edema: Trace  Mental Status: Normal mood and affect. Normal behavior. Normal judgment and thought content.   Assessment and Plan:  Pregnancy: G2P1001 at [redacted]w[redacted]d 1. [redacted] weeks gestation of pregnancy 2. Supervision of other normal pregnancy, antepartum Negative pelvic cultures last week.  Labor symptoms and general obstetric precautions including but not limited to vaginal bleeding, contractions, leaking of fluid and fetal movement were reviewed in detail with the patient. Please refer to After Visit Summary for other counseling recommendations.   Return in about 1 week (around  07/13/2023) for OFFICE OB VISIT (MD or APP).  Future Appointments  Date Time Provider Department Center  07/14/2023  3:30 PM Reva Bores, MD [facility]  07/21/2023  3:30 PM Reva Bores, MD [facility]    Jaynie Collins, MD

## 2023-07-07 ENCOUNTER — Encounter: Payer: Self-pay | Admitting: Obstetrics & Gynecology

## 2023-07-12 ENCOUNTER — Encounter (HOSPITAL_COMMUNITY): Payer: Self-pay | Admitting: Obstetrics & Gynecology

## 2023-07-12 ENCOUNTER — Inpatient Hospital Stay (HOSPITAL_COMMUNITY): Payer: Medicaid Other | Admitting: Anesthesiology

## 2023-07-12 ENCOUNTER — Inpatient Hospital Stay (HOSPITAL_COMMUNITY)
Admission: AD | Admit: 2023-07-12 | Discharge: 2023-07-14 | DRG: 807 | Disposition: A | Discharge status: Home / Self Care | Payer: Medicaid Other | Attending: Family Medicine | Admitting: Family Medicine

## 2023-07-12 ENCOUNTER — Other Ambulatory Visit: Payer: Self-pay

## 2023-07-12 DIAGNOSIS — O26893 Other specified pregnancy related conditions, third trimester: Secondary | ICD-10-CM | POA: Diagnosis not present

## 2023-07-12 DIAGNOSIS — Z79899 Other long term (current) drug therapy: Secondary | ICD-10-CM | POA: Diagnosis not present

## 2023-07-12 DIAGNOSIS — O99344 Other mental disorders complicating childbirth: Secondary | ICD-10-CM | POA: Diagnosis not present

## 2023-07-12 DIAGNOSIS — O326XX0 Maternal care for compound presentation, not applicable or unspecified: Secondary | ICD-10-CM | POA: Diagnosis not present

## 2023-07-12 DIAGNOSIS — Z87891 Personal history of nicotine dependence: Secondary | ICD-10-CM | POA: Diagnosis not present

## 2023-07-12 DIAGNOSIS — O134 Gestational [pregnancy-induced] hypertension without significant proteinuria, complicating childbirth: Principal | ICD-10-CM | POA: Diagnosis present

## 2023-07-12 DIAGNOSIS — O139 Gestational [pregnancy-induced] hypertension without significant proteinuria, unspecified trimester: Secondary | ICD-10-CM | POA: Insufficient documentation

## 2023-07-12 DIAGNOSIS — Z3A38 38 weeks gestation of pregnancy: Secondary | ICD-10-CM

## 2023-07-12 DIAGNOSIS — Z3480 Encounter for supervision of other normal pregnancy, unspecified trimester: Principal | ICD-10-CM

## 2023-07-12 LAB — CBC WITH DIFFERENTIAL/PLATELET
Abs Immature Granulocytes: 0.14 10*3/uL — ABNORMAL HIGH (ref 0.00–0.07)
Basophils Absolute: 0 10*3/uL (ref 0.0–0.1)
Basophils Relative: 0 %
Eosinophils Absolute: 0 10*3/uL (ref 0.0–0.5)
Eosinophils Relative: 0 %
HCT: 39 % (ref 36.0–46.0)
Hemoglobin: 13.3 g/dL (ref 12.0–15.0)
Immature Granulocytes: 1 %
Lymphocytes Relative: 5 %
Lymphs Abs: 0.9 10*3/uL (ref 0.7–4.0)
MCH: 33.9 pg (ref 26.0–34.0)
MCHC: 34.1 g/dL (ref 30.0–36.0)
MCV: 99.5 fL (ref 80.0–100.0)
Monocytes Absolute: 0.8 10*3/uL (ref 0.1–1.0)
Monocytes Relative: 4 %
Neutro Abs: 17.3 10*3/uL — ABNORMAL HIGH (ref 1.7–7.7)
Neutrophils Relative %: 90 %
Platelets: 208 10*3/uL (ref 150–400)
RBC: 3.92 MIL/uL (ref 3.87–5.11)
RDW: 12.7 % (ref 11.5–15.5)
WBC: 19.2 10*3/uL — ABNORMAL HIGH (ref 4.0–10.5)
nRBC: 0 % (ref 0.0–0.2)

## 2023-07-12 LAB — TYPE AND SCREEN
ABO/RH(D): A POS
Antibody Screen: NEGATIVE

## 2023-07-12 LAB — CBC
HCT: 41.5 % (ref 36.0–46.0)
Hemoglobin: 14.3 g/dL (ref 12.0–15.0)
MCH: 33.7 pg (ref 26.0–34.0)
MCHC: 34.5 g/dL (ref 30.0–36.0)
MCV: 97.9 fL (ref 80.0–100.0)
Platelets: 212 10*3/uL (ref 150–400)
RBC: 4.24 MIL/uL (ref 3.87–5.11)
RDW: 12.8 % (ref 11.5–15.5)
WBC: 13 10*3/uL — ABNORMAL HIGH (ref 4.0–10.5)
nRBC: 0 % (ref 0.0–0.2)

## 2023-07-12 LAB — COMPREHENSIVE METABOLIC PANEL
ALT: 31 U/L (ref 0–44)
AST: 41 U/L (ref 15–41)
Albumin: 2.5 g/dL — ABNORMAL LOW (ref 3.5–5.0)
Alkaline Phosphatase: 162 U/L — ABNORMAL HIGH (ref 38–126)
Anion gap: 11 (ref 5–15)
BUN: 7 mg/dL (ref 6–20)
CO2: 20 mmol/L — ABNORMAL LOW (ref 22–32)
Calcium: 8.5 mg/dL — ABNORMAL LOW (ref 8.9–10.3)
Chloride: 104 mmol/L (ref 98–111)
Creatinine, Ser: 0.51 mg/dL (ref 0.44–1.00)
GFR, Estimated: >60 mL/min (ref >60)
Glucose, Bld: 94 mg/dL (ref 70–99)
Potassium: 3.9 mmol/L (ref 3.5–5.1)
Sodium: 135 mmol/L (ref 135–145)
Total Bilirubin: 0.5 mg/dL (ref 0.3–1.2)
Total Protein: 5.6 g/dL — ABNORMAL LOW (ref 6.5–8.1)

## 2023-07-12 MED ORDER — LACTATED RINGERS IV SOLN: INTRAVENOUS | Status: DC

## 2023-07-12 MED ORDER — ZOLPIDEM TARTRATE 5 MG PO TABS: 5.0000 mg | ORAL_TABLET | Freq: Every evening | ORAL | Status: DC | PRN

## 2023-07-12 MED ORDER — NIFEDIPINE ER OSMOTIC RELEASE 30 MG PO TB24
30.0000 mg | ORAL_TABLET | Freq: Every day | ORAL | Status: DC
  Administered 2023-07-14: 30 mg via ORAL
  Filled 2023-07-12 (×2): qty 1

## 2023-07-12 MED ORDER — IBUPROFEN 600 MG PO TABS
600.0000 mg | ORAL_TABLET | Freq: Four times a day (QID) | ORAL | Status: DC
  Administered 2023-07-12 – 2023-07-13 (×3): 600 mg via ORAL
  Filled 2023-07-12 (×6): qty 1

## 2023-07-12 MED ORDER — ONDANSETRON HCL 4 MG PO TABS: 4.0000 mg | ORAL_TABLET | ORAL | Status: DC | PRN

## 2023-07-12 MED ORDER — ONDANSETRON HCL 4 MG/2ML IJ SOLN
4.0000 mg | Freq: Four times a day (QID) | INTRAMUSCULAR | Status: DC | PRN
  Filled 2023-07-12: qty 2

## 2023-07-12 MED ORDER — ONDANSETRON HCL 4 MG/2ML IJ SOLN: 4.0000 mg | INTRAMUSCULAR | Status: DC | PRN

## 2023-07-12 MED ORDER — BENZOCAINE-MENTHOL 20-0.5 % EX AERO
1.0000 | INHALATION_SPRAY | CUTANEOUS | Status: DC | PRN
  Administered 2023-07-12: 1 via TOPICAL
  Filled 2023-07-12: qty 56

## 2023-07-12 MED ORDER — TETANUS-DIPHTH-ACELL PERTUSSIS 5-2.5-18.5 LF-MCG/0.5 IM SUSY: 0.5000 mL | PREFILLED_SYRINGE | Freq: Once | INTRAMUSCULAR | Status: DC

## 2023-07-12 MED ORDER — EPHEDRINE 5 MG/ML INJ: 10.0000 mg | INTRAVENOUS | Status: DC | PRN

## 2023-07-12 MED ORDER — PHENYLEPHRINE 80 MCG/ML (10ML) SYRINGE FOR IV PUSH (FOR BLOOD PRESSURE SUPPORT): 80.0000 ug | PREFILLED_SYRINGE | INTRAVENOUS | Status: DC | PRN

## 2023-07-12 MED ORDER — SALINE SPRAY 0.65 % NA SOLN: 1.0000 | NASAL | Status: DC | PRN

## 2023-07-12 MED ORDER — SODIUM CHLORIDE 0.9% FLUSH: 3.0000 mL | INTRAVENOUS | Status: DC | PRN

## 2023-07-12 MED ORDER — SIMETHICONE 80 MG PO CHEW: 80.0000 mg | CHEWABLE_TABLET | ORAL | Status: DC | PRN

## 2023-07-12 MED ORDER — LACTATED RINGERS IV SOLN: 500.0000 mL | INTRAVENOUS | Status: DC | PRN

## 2023-07-12 MED ORDER — PRENATAL MULTIVITAMIN CH: 1.0000 | ORAL_TABLET | Freq: Every day | ORAL | Status: DC

## 2023-07-12 MED ORDER — LIDOCAINE HCL (PF) 1 % IJ SOLN
30.0000 mL | INTRAMUSCULAR | Status: AC | PRN
  Administered 2023-07-12: 30 mL via SUBCUTANEOUS
  Filled 2023-07-12: qty 30

## 2023-07-12 MED ORDER — SOD CITRATE-CITRIC ACID 500-334 MG/5ML PO SOLN: 30.0000 mL | ORAL | Status: DC | PRN

## 2023-07-12 MED ORDER — FENTANYL CITRATE (PF) 100 MCG/2ML IJ SOLN
100.0000 ug | Freq: Once | INTRAMUSCULAR | Status: AC
  Administered 2023-07-12: 100 ug via INTRAVENOUS
  Filled 2023-07-12: qty 2

## 2023-07-12 MED ORDER — DIBUCAINE (PERIANAL) 1 % EX OINT: 1.0000 | TOPICAL_OINTMENT | CUTANEOUS | Status: DC | PRN

## 2023-07-12 MED ORDER — PRENATAL MULTIVITAMIN CH
1.0000 | ORAL_TABLET | Freq: Every day | ORAL | Status: DC
  Administered 2023-07-13: 1 via ORAL
  Filled 2023-07-12: qty 1

## 2023-07-12 MED ORDER — FENTANYL-BUPIVACAINE-NACL 0.5-0.125-0.9 MG/250ML-% EP SOLN
12.0000 mL/h | EPIDURAL | Status: DC | PRN
  Administered 2023-07-12: 12 mL/h via EPIDURAL
  Filled 2023-07-12: qty 250

## 2023-07-12 MED ORDER — FUROSEMIDE 20 MG PO TABS
20.0000 mg | ORAL_TABLET | Freq: Every day | ORAL | Status: DC
  Administered 2023-07-13 – 2023-07-14 (×2): 20 mg via ORAL
  Filled 2023-07-12 (×2): qty 1

## 2023-07-12 MED ORDER — OXYCODONE-ACETAMINOPHEN 5-325 MG PO TABS: 2.0000 | ORAL_TABLET | ORAL | Status: DC | PRN

## 2023-07-12 MED ORDER — FERROUS SULFATE 325 (65 FE) MG PO TABS
325.0000 mg | ORAL_TABLET | Freq: Every day | ORAL | Status: DC
  Administered 2023-07-13 – 2023-07-14 (×2): 325 mg via ORAL
  Filled 2023-07-12 (×2): qty 1

## 2023-07-12 MED ORDER — OXYTOCIN BOLUS FROM INFUSION: 333.0000 mL | Freq: Once | INTRAVENOUS | Status: DC

## 2023-07-12 MED ORDER — OXYTOCIN-SODIUM CHLORIDE 30-0.9 UT/500ML-% IV SOLN
INTRAVENOUS | Status: AC
  Filled 2023-07-12: qty 500

## 2023-07-12 MED ORDER — DIPHENHYDRAMINE HCL 50 MG/ML IJ SOLN: 12.5000 mg | INTRAMUSCULAR | Status: DC | PRN

## 2023-07-12 MED ORDER — FENTANYL CITRATE (PF) 100 MCG/2ML IJ SOLN: 50.0000 ug | INTRAMUSCULAR | Status: DC | PRN

## 2023-07-12 MED ORDER — OXYTOCIN-SODIUM CHLORIDE 30-0.9 UT/500ML-% IV SOLN
2.5000 [IU]/h | INTRAVENOUS | Status: DC
  Administered 2023-07-12: 2.5 [IU]/h via INTRAVENOUS

## 2023-07-12 MED ORDER — WITCH HAZEL-GLYCERIN EX PADS: 1.0000 | MEDICATED_PAD | CUTANEOUS | Status: DC | PRN

## 2023-07-12 MED ORDER — LACTATED RINGERS IV SOLN
500.0000 mL | Freq: Once | INTRAVENOUS | Status: DC
  Administered 2023-07-12: 500 mL via INTRAVENOUS

## 2023-07-12 MED ORDER — COCONUT OIL OIL: 1.0000 | TOPICAL_OIL | Status: DC | PRN

## 2023-07-12 MED ORDER — DIPHENHYDRAMINE HCL 25 MG PO CAPS: 25.0000 mg | ORAL_CAPSULE | Freq: Four times a day (QID) | ORAL | Status: DC | PRN

## 2023-07-12 MED ORDER — ACETAMINOPHEN 325 MG PO TABS: 650.0000 mg | ORAL_TABLET | ORAL | Status: DC | PRN

## 2023-07-12 MED ORDER — FLUOXETINE HCL 20 MG PO CAPS
20.0000 mg | ORAL_CAPSULE | Freq: Every day | ORAL | Status: DC
  Administered 2023-07-12 – 2023-07-13 (×2): 20 mg via ORAL
  Filled 2023-07-12 (×2): qty 1

## 2023-07-12 MED ORDER — SODIUM CHLORIDE 0.9% FLUSH
3.0000 mL | Freq: Two times a day (BID) | INTRAVENOUS | Status: DC
  Administered 2023-07-13: 3 mL via INTRAVENOUS

## 2023-07-12 MED ORDER — OXYCODONE-ACETAMINOPHEN 5-325 MG PO TABS: 1.0000 | ORAL_TABLET | ORAL | Status: DC | PRN

## 2023-07-12 MED ORDER — SENNOSIDES-DOCUSATE SODIUM 8.6-50 MG PO TABS
2.0000 | ORAL_TABLET | ORAL | Status: DC
  Administered 2023-07-13 – 2023-07-14 (×2): 2 via ORAL
  Filled 2023-07-12 (×2): qty 2

## 2023-07-12 MED ORDER — ACETAMINOPHEN 325 MG PO TABS
650.0000 mg | ORAL_TABLET | ORAL | Status: DC | PRN
  Administered 2023-07-12 – 2023-07-13 (×4): 650 mg via ORAL
  Filled 2023-07-12 (×5): qty 2

## 2023-07-12 MED ORDER — SODIUM CHLORIDE 0.9 % IV SOLN: 250.0000 mL | INTRAVENOUS | Status: DC | PRN

## 2023-07-12 MED ORDER — LIDOCAINE HCL (PF) 1 % IJ SOLN
INTRAMUSCULAR | Status: DC | PRN
  Administered 2023-07-12: 11 mL via EPIDURAL

## 2023-07-12 NOTE — H&P (Addendum)
OBSTETRIC ADMISSION HISTORY AND PHYSICAL  Mariah Sullivan is a 32 y.o. female G2P2002 with IUP at [redacted]w[redacted]d by 1st trimester [place] presenting for spontaneous labor. She reports +FMs, No LOF, no VB. Began having contractions and progressed quickly. Patient was checked in MAU and was 4cm, given a dose of fentanyl, and 30 minutes later was 9cm upon arrival to L&D. She plans on breast feeding. She request POPs for birth control.  She received her prenatal care at  [facility]    Dating: By 1st trimester [place] --->  Estimated Date of Delivery: 07/26/23  Sono:   @[redacted]w[redacted]d , CWD, normal anatomy, breech presentation, anterior placenta lie, 668g, 29% EFW   Prenatal History/Complications: Asthma, GAD, ADHD, GERD, Hx of MJ use  Past Medical History: Past Medical History:  Diagnosis Date   ADHD    Anemia    Anemia 03/20/2022   Angio-edema    Anxiety    Anxiety 03/02/2018   Asthma    Attention deficit hyperactivity disorder 03/20/2022   Chronic urticaria    Depression    Eczema    Eosinophilia    Eosinophilia, unspecified 03/20/2022   GERD (gastroesophageal reflux disease)    Major depression, single episode 03/20/2022   Psoriasis    Psoriasis 03/20/2022   Ulcer    Urticaria     Past Surgical History: Past Surgical History:  Procedure Laterality Date   ADENOIDECTOMY     FRACTURE SURGERY     SINOSCOPY     TYMPANOSTOMY TUBE PLACEMENT      Obstetrical History: OB History     Gravida  2   Para  2   Term  2   Preterm      AB      Living  2      SAB      IAB      Ectopic      Multiple  0   Live Births  2           Social History Social History   Socioeconomic History   Marital status: Single    Spouse name: Not on file   Number of children: Not on file   Years of education: Not on file   Highest education level: Not on file  Occupational History   Not on file  Tobacco Use   Smoking status: Former    Current packs/day: 0.50    Average packs/day: 0.5 packs/day  for 6.0 years (3.0 ttl pk-yrs)    Types: Cigarettes   Smokeless tobacco: Never  Vaping Use   Vaping status: Some Days  Substance and Sexual Activity   Alcohol use: Not Currently    Comment: social use not very often   Drug use: Not Currently    Types: Marijuana    Comment: marijuana 1 time per week   Sexual activity: Yes    Birth control/protection: None  Other Topics Concern   Not on file  Social History Narrative   Not on file   Social Determinants of Health   Financial Resource Strain: Not on file  Food Insecurity: Not on file  Transportation Needs: Not on file  Physical Activity: Not on file  Stress: Not on file  Social Connections: Not on file    Family History: Family History  Problem Relation Age of Onset   Allergic rhinitis Mother    Hyperlipidemia Mother    Asthma Father    Cancer Father    Diabetes Maternal Grandmother    Diabetes Maternal Grandfather  Heart disease Maternal Grandfather    Hyperlipidemia Maternal Grandfather    Stroke Paternal Grandmother    Eczema Neg Hx    Urticaria Neg Hx     Allergies: Allergies  Allergen Reactions   Latex Rash    Medications Prior to Admission  Medication Sig Dispense Refill Last Dose   amphetamine-dextroamphetamine (ADDERALL) 20 MG tablet Take 20 mg by mouth 3 (three) times daily.   Past Week   ferrous sulfate 324 MG TBEC Take 324 mg by mouth daily.   07/11/2023   FLUoxetine (PROZAC) 20 MG capsule Take 20 mg by mouth at bedtime.   07/11/2023   Prenatal Vit-Fe Fumarate-FA (PRENATAL MULTIVITAMIN) TABS tablet Take 1 tablet by mouth daily at 12 noon.   07/11/2023   cyanocobalamin (VITAMIN B12) 500 MCG tablet Take 500 mcg by mouth daily.      Doxylamine-Pyridoxine (DICLEGIS) 10-10 MG TBEC Take 2 tablets by mouth at bedtime. If symptoms persist, add one tablet in the morning and one in the afternoon 100 tablet 5    ondansetron (ZOFRAN) 4 MG tablet Take 1 tablet (4 mg total) by mouth every 8 (eight) hours as needed  for nausea or vomiting. (Patient not taking: Reported on 05/05/2023) 20 tablet 0    sodium chloride (OCEAN) 0.65 % SOLN nasal spray Place 1 spray into both nostrils as needed for congestion. 30 mL 2      Review of Systems   All systems reviewed and negative except as stated in HPI  Blood pressure (!) 139/92, pulse 77, temperature 98.6 F (37 C), temperature source Oral, resp. rate 16, SpO2 100%, unknown if currently breastfeeding. General appearance: Diaphoretic, yelling in pain upon arrival to L&D  Lungs: no respiratory distress Heart: regular rate Extremities: Homans sign is negative, no sign of DVT Presentation: cephalic, confirmed with bedside US Fetal monitoringBaseline: 125 bpm, Variability: Good {> 6 bpm), Accelerations: Reactive, and Decelerations: Variable: mild Uterine activity: contracting every 1-3 minutes  Dilation: 10 Effacement (%): 100 Station: Plus 2 Exam by:: Suzie Portela, CNM   Prenatal labs: ABO, Rh: --/--/A POS (07/22 1529) Antibody: NEG (07/22 1529) Rubella: 6.44 (01/09 1610) RPR: Non Reactive (05/15 1006)  HBsAg: Negative (01/09 1610)  HIV: Non Reactive (05/15 1006)  GBS: Negative/-- (07/09 1620)  2 hr Glucola normal Genetic screening  NIPS low risk, AFP negative, Horizon negative x4  Anatomy US: normal  Prenatal Transfer Tool  Maternal Diabetes: No Genetic Screening: Normal Maternal Ultrasounds/Referrals: Normal Fetal Ultrasounds or other Referrals:  None Maternal Substance Abuse:  No Significant Maternal Medications:  Meds include: Prozac Other: Adderall Significant Maternal Lab Results:  Group B Strep negative Number of Prenatal Visits:greater than 3 verified prenatal visits Other Comments:  None  Results for orders placed or performed during the hospital encounter of 07/12/23 (from the past 24 hour(s))  CBC   Collection Time: 07/12/23  3:29 PM  Result Value Ref Range   WBC 13.0 (H) 4.0 - 10.5 K/uL   RBC 4.24 3.87 - 5.11 MIL/uL   Hemoglobin 14.3  12.0 - 15.0 g/dL   HCT 70.6 23.7 - 62.8 %   MCV 97.9 80.0 - 100.0 fL   MCH 33.7 26.0 - 34.0 pg   MCHC 34.5 30.0 - 36.0 g/dL   RDW 31.5 17.6 - 16.0 %   Platelets 212 150 - 400 K/uL   nRBC 0.0 0.0 - 0.2 %  Type and screen [facility]   Collection Time: 07/12/23  3:29 PM  Result Value Ref Range  ABO/RH(D) A POS    Antibody Screen NEG    Sample Expiration      07/15/2023,2359 Performed at [facility], [address], [place], [place] [address]     Patient Active Problem List   Diagnosis Date Noted   Normal labor 07/12/2023   Vaginal delivery 07/12/2023   Elevated MCV 05/07/2023   Supervision of other normal pregnancy, antepartum 12/29/2022   Gastroesophageal reflux disease without esophagitis 03/20/2022   History of marijuana use 03/02/2018    Assessment/Plan:  Dorraine Ellender is a 32 y.o. G2P2002 at [redacted]w[redacted]d here for spontaneous labor.   #Labor: Patient in active labor and complete with bulging bag upon arrival to L&D, delivery eminent.  #Pain: Patient requesting epidural prior to delivery #FWB: Cat II  #ID:  GBS negative #MOF: breast #MOC: POPs #Circ:  yes  Glee Arvin, MD  07/12/2023, 6:19 PM  I confirm that I have verified and agree with the information documented in the resident's note.   Please see delivery note for additional Physical exam.   Carlynn Herald, CNM 07/12/2023 6:36 PM

## 2023-07-12 NOTE — MAU Note (Signed)
.  Mariah Sullivan is a 32 y.o. at [redacted]w[redacted]d here in MAU reporting: ctx every 1-2 minutes that started at 1200. Denies VB. States she's unsure if she's leaking fluid or not. +FM.   Pain score: 10 Vitals:   07/12/23 1526  BP: 135/87  Pulse: 85  Resp: 20  SpO2: 100%     FHT:117

## 2023-07-12 NOTE — Anesthesia Preprocedure Evaluation (Signed)
Anesthesia Evaluation  Patient identified by MRN, date of birth, ID band Patient awake    Reviewed: Allergy & Precautions, H&P , NPO status , Patient's Chart, lab work & pertinent test results  Airway Mallampati: II  TM Distance: >3 FB Neck ROM: Full    Dental no notable dental hx.    Pulmonary asthma , former smoker   Pulmonary exam normal breath sounds clear to auscultation       Cardiovascular negative cardio ROS Normal cardiovascular exam Rhythm:Regular Rate:Normal     Neuro/Psych negative neurological ROS  negative psych ROS   GI/Hepatic negative GI ROS, Neg liver ROS,,,  Endo/Other  negative endocrine ROS    Renal/GU negative Renal ROS  negative genitourinary   Musculoskeletal negative musculoskeletal ROS (+)    Abdominal   Peds negative pediatric ROS (+)  Hematology negative hematology ROS (+)   Anesthesia Other Findings   Reproductive/Obstetrics (+) Pregnancy                             Anesthesia Physical Anesthesia Plan  ASA: 2  Anesthesia Plan: Epidural   Post-op Pain Management:    Induction:   PONV Risk Score and Plan:   Airway Management Planned:   Additional Equipment:   Intra-op Plan:   Post-operative Plan:   Informed Consent:   Plan Discussed with:   Anesthesia Plan Comments:        Anesthesia Quick Evaluation

## 2023-07-12 NOTE — Lactation Note (Signed)
This note was copied from a baby's chart. Lactation Consultation Note  Patient Name: Mariah Sullivan WGNFA'O Date: 07/12/2023 Age:34 hours Reason for consult: Initial assessment LC entered room, Birth Parent had finished BF infant for 20 minutes. Birth Parent feels infant is latching well at the breast and she is breastfeeding him by hunger cues, on demand, 8 to 12+ times within 24 hours, skin to skin. Birth Parent knows to call [facility] services if latch assistance is needed. LC discussed infant's input and output, infant had one stool since birth. LC discussed the importance of maternal rest, diet and hydration. Birth Parent already knows how to hand express and easily self expressed colostrum. Birth Parent was  made aware of O/P services, breastfeeding support groups, community resources, and our phone # for post-discharge questions.    Maternal Data Has patient been taught Hand Expression?: Yes Does the patient have breastfeeding experience prior to this delivery?: Yes How long did the patient breastfeed?: 18 months  Feeding Mother's Current Feeding Choice: Breast Milk  LATCH Score  LC did not observe latch due infant feeding prior to [facility] entering the room. Birth Parent feels infant is latching well at the breast.                   Lactation Tools Discussed/Used    Interventions Interventions: Breast feeding basics reviewed;Position options;Skin to skin;Breast compression;Hand express;LC Services brochure  Discharge Pump: Hands Free;DEBP;Manual;Personal  Consult Status Consult Status: Follow-up Date: 07/13/23 Follow-up type: In-patient    Frederico Hamman 07/12/2023, 6:49 PM

## 2023-07-12 NOTE — Anesthesia Procedure Notes (Signed)
Epidural Patient location during procedure: OB Start time: 07/12/2023 4:20 PM End time: 07/12/2023 4:31 PM  Staffing Anesthesiologist: Lowella Curb, MD Performed: anesthesiologist   Preanesthetic Checklist Completed: patient identified, IV checked, site marked, risks and benefits discussed, surgical consent, monitors and equipment checked, pre-op evaluation and timeout performed  Epidural Patient position: sitting Prep: ChloraPrep Patient monitoring: heart rate, cardiac monitor, continuous pulse ox and blood pressure Approach: midline Location: L2-L3 Injection technique: LOR saline  Needle:  Needle type: Tuohy  Needle gauge: 17 G Needle length: 9 cm Needle insertion depth: 5 cm Catheter type: closed end flexible Catheter size: 20 Guage Catheter at skin depth: 9 cm Test dose: negative  Assessment Events: blood not aspirated, injection not painful, no injection resistance, no paresthesia and negative IV test  Additional Notes Reason for block:procedure for pain

## 2023-07-12 NOTE — Progress Notes (Signed)
Received a call from postpartum nurse reporting her BP has been elevated since delivery (130-150s/80-100s). No evidence of gHTN diagnosis in this pregnancy. Per nurse report, patient otherwise doing well in this postpartum period.  - Will obtain Pre-E labs (CBC, CMP, and urine P/C).  - Start lasix 20 mg daily x5 days along with procardia 30mg  .

## 2023-07-12 NOTE — Discharge Summary (Signed)
Postpartum Discharge Summary  Date of Service updated***     Patient Name: Mariah Sullivan DOB: October 12, 1991 MRN: [id number]  Date of admission: 07/12/2023 Delivery date:07/12/2023 Delivering provider: Carley Hammed P Date of discharge: 07/12/2023  Admitting diagnosis: Normal labor [O80, Z37.9] Intrauterine pregnancy: [redacted]w[redacted]d     Secondary diagnosis:  Principal Problem:   Vaginal delivery Active Problems:   Normal labor  Additional problems: ***    Discharge diagnosis: {DX.:23714}                                              Post partum procedures:{Postpartum procedures:23558} Augmentation: N/A Complications: None  Hospital course: Onset of Labor With Vaginal Delivery      32 y.o. yo G2P2002 at [redacted]w[redacted]d was admitted in Active Labor on 07/12/2023. Labor course was complicated by Precipitous delivery.    Membrane Rupture Time/Date: 4:28 PM,07/12/2023  Delivery Method:Vaginal, Spontaneous Episiotomy: None Lacerations:  Periurethral Patient had a postpartum course complicated by .  She is ambulating, tolerating a regular diet, passing flatus, and urinating well. Patient is discharged home in stable condition on 07/12/23.  Newborn Data: Birth date:07/12/2023 Birth time:4:33 PM Gender:Female Living status:Living Apgars:8 ,9  Weight:3000 g  Magnesium Sulfate received: No BMZ received: No Rhophylac:N/A MMR:N/A T-DaP:*** Flu: {BJY:78295} Transfusion:{Transfusion received:30440034}  Physical exam  Vitals:   07/12/23 1749 07/12/23 1753 07/12/23 1811 07/12/23 1814  BP: (!) 142/86 (!) 142/86 (!) 140/101 (!) 139/92  Pulse: 81 71 77 77  Resp: 18 18 16 16   Temp: 97.8 F (36.6 C)  98.6 F (37 C) 98.6 F (37 C)  TempSrc: Oral  Oral Oral  SpO2: 100%     Weight:      Height:       General: {Exam; general:21111117} Lochia: {Desc; appropriate/inappropriate:30686::"appropriate"} Uterine Fundus: {Desc; firm/soft:30687} Incision: {Exam; incision:21111123} DVT Evaluation: {Exam;  AOZ:3086578} Labs: Lab Results  Component Value Date   WBC 13.0 (H) 07/12/2023   HGB 14.3 07/12/2023   HCT 41.5 07/12/2023   MCV 97.9 07/12/2023   PLT 212 07/12/2023      Latest Ref Rng & Units 06/16/2023    4:25 PM  CMP  Glucose 70 - 99 mg/dL 92   BUN 6 - 20 mg/dL 5   Creatinine 4.69 - 6.29 mg/dL 5.28   Sodium 413 - 244 mmol/L 138   Potassium 3.5 - 5.2 mmol/L 3.9   Chloride 96 - 106 mmol/L 106   CO2 20 - 29 mmol/L 20   Calcium 8.7 - 10.2 mg/dL 8.4   Total Protein 6.0 - 8.5 g/dL 5.2   Total Bilirubin 0.0 - 1.2 mg/dL 0.2   Alkaline Phos 44 - 121 IU/L 159   AST 0 - 40 IU/L 19   ALT 0 - 32 IU/L 9    Edinburgh Score:     No data to display           After visit meds:  Allergies as of 07/12/2023       Reactions   Latex Rash     Med Rec must be completed prior to using this [facility]***        Discharge home in stable condition Infant Feeding: {Baby feeding:23562} Infant Disposition:{CHL IP OB HOME WITH WNUUVO:53664} Discharge instruction: per After Visit Summary and Postpartum booklet. Activity: Advance as tolerated. Pelvic rest for 6 weeks.  Diet: {OB QIHK:74259563} Future Appointments: Future  Appointments  Date Time Provider Department Center  07/14/2023  3:30 PM Reva Bores, MD [facility]  07/21/2023  3:30 PM Reva Bores, MD [facility]   Follow up Visit:   Please schedule this patient for a Virtual postpartum visit in 6 weeks with the following provider: Any provider. Additional Postpartum F/U:BP check 1 week  Low risk pregnancy complicated by: GDM Delivery mode:  Vaginal, Spontaneous Anticipated Birth Control:  POPs  Message sent to [facility] on 07/12/23 @ 1842  07/12/2023 Claudette Head, CNM

## 2023-07-13 DIAGNOSIS — O139 Gestational [pregnancy-induced] hypertension without significant proteinuria, unspecified trimester: Secondary | ICD-10-CM | POA: Insufficient documentation

## 2023-07-13 HISTORY — DX: Gestational (pregnancy-induced) hypertension without significant proteinuria, unspecified trimester: O13.9

## 2023-07-13 LAB — RPR: RPR Ser Ql: NONREACTIVE

## 2023-07-13 MED ORDER — OXYCODONE HCL 5 MG PO TABS
5.0000 mg | ORAL_TABLET | Freq: Four times a day (QID) | ORAL | Status: DC | PRN
  Administered 2023-07-13 – 2023-07-14 (×4): 5 mg via ORAL
  Filled 2023-07-13 (×5): qty 1

## 2023-07-13 NOTE — Progress Notes (Signed)
Post Partum Day 1 Subjective: no complaints, up ad lib, voiding and tolerating PO, small lochia, plans to breastfeed, oral progesterone-only contraceptive  Objective: Blood pressure 139/83, pulse (!) 52, temperature 98 F (36.7 C), temperature source Oral, resp. rate 18, height 5' 5.5" (1.664 m), weight 54.9 kg, SpO2 100%, unknown if currently breastfeeding.  Physical Exam:  General: alert, cooperative and no distress Lochia:normal flow Chest: CTAB Heart: RRR no m/r/g Abdomen: +BS, soft, nontender,  Uterine Fundus: firm DVT Evaluation: No evidence of DVT seen on physical exam. Extremities: trce edema  Recent Labs    07/12/23 1529 07/12/23 1841  HGB 14.3 13.3  HCT 41.5 39.0    Assessment/Plan: Plan for discharge tomorrow, Breastfeeding, and Circumcision prior to discharge   LOS: 1 day   Mariah Sullivan 07/13/2023, 7:37 AM

## 2023-07-13 NOTE — Lactation Note (Signed)
This note was copied from a baby's chart. Lactation Consultation Note  Patient Name: Mariah Sullivan NATFT'D Date: 07/13/2023 Age:34 hours Reason for consult: Follow-up assessment  P2, Baby recently breastfed for 30 min.  Reviewed cluster feeding and suggest calling for help as needed.  Mother does not have questions as this time.   Maternal Data Has patient been taught Hand Expression?: Yes  Feeding Mother's Current Feeding Choice: Breast Milk   Interventions Interventions: Education  Consult Status Consult Status: Follow-up Date: 07/14/23 Follow-up type: In-patient    Dahlia Byes [facility] 07/13/2023, 11:38 AM

## 2023-07-13 NOTE — Progress Notes (Signed)
Called 1st Call L&D ([facility]) re: elevated BP 2 readings in a row (1 hr apart).  BP is increasing, patient currently on Lasix/Procardia.  He said to change parameters to severe BP parameters, call if BP >160/110.

## 2023-07-13 NOTE — Anesthesia Postprocedure Evaluation (Signed)
Anesthesia Post Note  Patient: Mariah Sullivan  Procedure(s) Performed: AN AD HOC LABOR EPIDURAL     Patient location during evaluation: Mother Baby Anesthesia Type: Epidural Level of consciousness: awake and alert and oriented Pain management: satisfactory to patient Vital Signs Assessment: post-procedure vital signs reviewed and stable Respiratory status: respiratory function stable Cardiovascular status: stable Postop Assessment: no headache, no backache, epidural receding, patient able to bend at knees, no signs of nausea or vomiting, adequate PO intake and able to ambulate Anesthetic complications: no   No notable events documented.  Last Vitals:  Vitals:   07/13/23 0449 07/13/23 1041  BP: 139/83 120/88  Pulse: (!) 52 (!) 59  Resp: 18 16  Temp: 36.7 C 37.1 C  SpO2: 100%     Last Pain:  Vitals:   07/13/23 1209  TempSrc:   PainSc: 0-No pain   Pain Goal:                   Tamra Koos

## 2023-07-14 ENCOUNTER — Encounter (HOSPITAL_COMMUNITY): Payer: Self-pay | Admitting: Family Medicine

## 2023-07-14 ENCOUNTER — Encounter: Payer: Medicaid Other | Admitting: Family Medicine

## 2023-07-14 ENCOUNTER — Other Ambulatory Visit: Payer: Self-pay

## 2023-07-14 ENCOUNTER — Other Ambulatory Visit (HOSPITAL_COMMUNITY): Payer: Self-pay

## 2023-07-14 ENCOUNTER — Ambulatory Visit (HOSPITAL_COMMUNITY): Payer: Self-pay

## 2023-07-14 MED ORDER — NORETHINDRONE 0.35 MG PO TABS
1.0000 | ORAL_TABLET | Freq: Every day | ORAL | 11 refills | Status: AC
  Filled 2023-07-14 – 2024-01-16 (×2): qty 28, 28d supply, fill #0
  Filled 2024-03-17: qty 28, 28d supply, fill #1
  Filled 2024-04-23: qty 28, 28d supply, fill #2

## 2023-07-14 MED ORDER — NIFEDIPINE ER 30 MG PO TB24
30.0000 mg | ORAL_TABLET | Freq: Every day | ORAL | 0 refills | Status: DC
  Filled 2023-07-14: qty 30, 30d supply, fill #0

## 2023-07-14 MED ORDER — IBUPROFEN 600 MG PO TABS
600.0000 mg | ORAL_TABLET | Freq: Four times a day (QID) | ORAL | 0 refills | Status: DC
  Filled 2023-07-14: qty 30, 8d supply, fill #0

## 2023-07-14 MED ORDER — FUROSEMIDE 20 MG PO TABS
20.0000 mg | ORAL_TABLET | Freq: Every day | ORAL | 0 refills | Status: DC
Start: 2023-07-14 — End: 2023-08-26
  Filled 2023-07-14: qty 5, 5d supply, fill #0

## 2023-07-14 MED ORDER — OXYCODONE HCL 5 MG PO TABS
5.0000 mg | ORAL_TABLET | Freq: Four times a day (QID) | ORAL | 0 refills | Status: DC | PRN
  Filled 2023-07-14: qty 6, 2d supply, fill #0

## 2023-07-14 MED ORDER — ACETAMINOPHEN 325 MG PO TABS
650.0000 mg | ORAL_TABLET | ORAL | 0 refills | Status: AC | PRN
  Filled 2023-07-14: qty 100, 9d supply, fill #0

## 2023-07-14 NOTE — Lactation Note (Signed)
This note was copied from a baby's chart. Lactation Consultation Note  Patient Name: Mariah Sullivan UJWJX'B Date: 07/14/2023 Age:34 hours Reason for consult: Follow-up assessment;Early term 37-38.6wks Mom stated the baby is BF very well. He is emptying breast well. Mom has no questions or concerns about BF at this time. Discussed engorgement and management. Baby has a lot of output. Praised mom. Reminded of LC OP services. Encouraged to call if needs assistance or questions.   Maternal Data Has patient been taught Hand Expression?: Yes Does the patient have breastfeeding experience prior to this delivery?: Yes How long did the patient breastfeed?: 18 months  Feeding    LATCH Score       Type of Nipple: Everted at rest and after stimulation  Comfort (Breast/Nipple): Soft / non-tender (mom stated breast are filling and baby softens them after feeding.)         Lactation Tools Discussed/Used    Interventions Interventions: Breast feeding basics reviewed  Discharge    Consult Status Consult Status: Complete Date: 07/14/23    Charyl Dancer 07/14/2023, 10:37 PM

## 2023-07-14 NOTE — Social Work (Signed)
CSW informed RN according to policy, MOB's THC hx does not warrant UDS screen at this time.   CSW completed chart review and MOB has hx of Anxiety and depression.  MOB was referred for history of depression/anxiety.  * Referral screened out by Clinical Social Worker because none of the following criteria appear to apply:  ~ History of anxiety/depression during this pregnancy, or of post-partum depression following prior delivery.  ~ Diagnosis of anxiety and/or depression within last 3 years OR * MOB's symptoms currently being treated with medication and/or therapy. MOB is currently prescribed Prozac.   Please contact the Clinical Social Worker if needs arise, or by MOB request.  Wende Neighbors, LCSWA Clinical Social Worker [phone]

## 2023-07-14 NOTE — Progress Notes (Signed)
Discharge instructions reviewed with patient and her husband. All questions answered and pt verbalized understanding. Pt requests to room in with infant while he stays in the hospital.

## 2023-07-19 ENCOUNTER — Ambulatory Visit (INDEPENDENT_AMBULATORY_CARE_PROVIDER_SITE_OTHER): Payer: Medicaid Other

## 2023-07-19 VITALS — BP 118/82 | HR 83

## 2023-07-19 DIAGNOSIS — O135 Gestational [pregnancy-induced] hypertension without significant proteinuria, complicating the puerperium: Secondary | ICD-10-CM

## 2023-07-19 DIAGNOSIS — O139 Gestational [pregnancy-induced] hypertension without significant proteinuria, unspecified trimester: Secondary | ICD-10-CM

## 2023-07-19 NOTE — Progress Notes (Signed)
Subjective:  Mariah Sullivan is a 32 y.o. female here for BP check.  Hypertension ROS: taking medications as instructed, no medication side effects noted, no TIA's, no chest pain on exertion, no dyspnea on exertion, and no swelling of ankles.    Objective:  LMP  (LMP Unknown)   Appearance alert, well appearing, and in no distress.    Assessment:   Blood Pressure today in office well controlled.    Plan:  Keep Part Partum Appointment .

## 2023-07-21 ENCOUNTER — Encounter: Payer: Medicaid Other | Admitting: Family Medicine

## 2023-07-23 DIAGNOSIS — F909 Attention-deficit hyperactivity disorder, unspecified type: Secondary | ICD-10-CM | POA: Diagnosis not present

## 2023-08-26 ENCOUNTER — Other Ambulatory Visit: Payer: Medicaid Other

## 2023-08-26 ENCOUNTER — Encounter: Payer: Self-pay | Admitting: Obstetrics & Gynecology

## 2023-08-26 ENCOUNTER — Ambulatory Visit (INDEPENDENT_AMBULATORY_CARE_PROVIDER_SITE_OTHER): Payer: Medicaid Other | Admitting: Obstetrics & Gynecology

## 2023-08-26 ENCOUNTER — Ambulatory Visit: Payer: Medicaid Other | Admitting: Obstetrics & Gynecology

## 2023-08-26 NOTE — Progress Notes (Signed)
Post Partum Visit Note  Mariah Sullivan is a 32 y.o. G33P2002 female who presents for a postpartum visit. She is 6 weeks postpartum following a normal spontaneous vaginal delivery.  I have fully reviewed the prenatal and intrapartum course. The delivery was at [redacted]w[redacted]d gestational weeks.  Anesthesia: epidural. Postpartum course has been uncomplicated. Baby is doing well. Baby is feeding by breast. Bleeding staining only. Bowel function is normal. Bladder function is normal. Patient is not sexually active. Contraception method is oral progesterone-only contraceptive. Postpartum depression screening: negative but already on Prozac for history of depression.   The pregnancy intention screening data noted above was reviewed. Potential methods of contraception were discussed. The patient elected to proceed with POPs.   Edinburgh Postnatal Depression Scale - 08/26/23 0856       Edinburgh Postnatal Depression Scale:  In the Past 7 Days   I have been able to laugh and see the funny side of things. 0    I have looked forward with enjoyment to things. 0    I have blamed myself unnecessarily when things went wrong. 1    I have been anxious or worried for no good reason. 0    I have felt scared or panicky for no good reason. 0    Things have been getting on top of me. 1    I have been so unhappy that I have had difficulty sleeping. 0    I have felt sad or miserable. 0    I have been so unhappy that I have been crying. 0    The thought of harming myself has occurred to me. 0    Edinburgh Postnatal Depression Scale Total 2             Health Maintenance Due  Topic Date Due   DTaP/Tdap/Td (1 - Tdap) Never done   INFLUENZA VACCINE  07/22/2023   COVID-19 Vaccine (1 - 2023-24 season) Never done    The following portions of the patient's history were reviewed and updated as appropriate: allergies, current medications, past family history, past medical history, past social history, past  surgical history, and problem list.  Review of Systems Pertinent items noted in HPI and remainder of comprehensive ROS otherwise negative.  Objective: (RN present for exam as chaperone)  BP 107/71   Pulse 99   Ht 5\' 5"  (1.651 m)   Wt 130 lb 9.6 oz (59.2 kg)   LMP  (LMP Unknown)   Breastfeeding Yes   BMI 21.73 kg/m    General:  alert and no distress   Breasts:  normal  Lungs: clear to auscultation bilaterally  Heart:  regular rate and rhythm  Abdomen: soft, non-tender; bowel sounds normal; no masses,  no organomegaly   GU exam:  normal       Assessment:   Normal postpartum exam.   Plan:   Essential components of care per ACOG recommendations:  1.  Mood and well being: Patient with negative depression screening today.Already on Prozac. - Patient tobacco use? No.   - hx of drug use? No.    2. Infant care and feeding:  -Patient currently breastmilk feeding? Yes. Reviewed importance of draining breast regularly to support lactation.  -Social determinants of health (SDOH) reviewed in EPIC. No concerns.  3. Sexuality, contraception and birth spacing - Patient does not want a pregnancy in the next year.  Desired family size is 2 children.  - Reviewed reproductive life planning. Reviewed contraceptive methods based on pt  preferences and effectiveness.  Patient desired Oral Contraceptive today.   - Discussed birth spacing of 18 months.  4. Sleep and fatigue -Encouraged family/partner/community support of 4 hrs of uninterrupted sleep to help with mood and fatigue  5. Physical Recovery  - Discussed patients delivery and complications. She describes her labor as good. - Patient had a Vaginal, no problems at delivery. Patient had a  bilateral periurethral  lacerations, no complications with healing.  - Patient has urinary incontinence? No. - Patient is safe to resume physical and sexual activity  6.  Health Maintenance - HM due items addressed Yes - Last pap smear  Diagnosis   Date Value Ref Range Status  12/29/2022   Final   - Negative for intraepithelial lesion or malignancy (NILM)   Pap smear not done at today's visit.  -Breast Cancer screening indicated? No.  - Had transient postpartum HTN, now resolved, on no meds currently.    Jaynie Collins, MD [facility] [employer], [facility]

## 2024-01-17 ENCOUNTER — Other Ambulatory Visit: Payer: Self-pay

## 2024-01-17 ENCOUNTER — Other Ambulatory Visit (HOSPITAL_COMMUNITY): Payer: Self-pay

## 2024-02-04 DIAGNOSIS — F909 Attention-deficit hyperactivity disorder, unspecified type: Secondary | ICD-10-CM | POA: Diagnosis not present

## 2024-02-04 DIAGNOSIS — J069 Acute upper respiratory infection, unspecified: Secondary | ICD-10-CM | POA: Diagnosis not present

## 2024-03-02 ENCOUNTER — Emergency Department
Admission: EM | Admit: 2024-03-02 | Discharge: 2024-03-03 | Disposition: A | Discharge status: Home / Self Care | Attending: Emergency Medicine | Admitting: Emergency Medicine

## 2024-03-02 ENCOUNTER — Other Ambulatory Visit: Payer: Self-pay

## 2024-03-02 DIAGNOSIS — K529 Noninfective gastroenteritis and colitis, unspecified: Secondary | ICD-10-CM | POA: Insufficient documentation

## 2024-03-02 DIAGNOSIS — R112 Nausea with vomiting, unspecified: Secondary | ICD-10-CM | POA: Diagnosis present

## 2024-03-02 NOTE — ED Triage Notes (Signed)
 Patient C/O vomiting and rectal bleeding that began this evening. Patient states that her son had a stomach virus and she believes she now has it as well.

## 2024-03-03 LAB — COMPREHENSIVE METABOLIC PANEL
ALT: 20 U/L (ref 0–44)
AST: 34 U/L (ref 15–41)
Albumin: 4.5 g/dL (ref 3.5–5.0)
Alkaline Phosphatase: 68 U/L (ref 38–126)
Anion gap: 18 — ABNORMAL HIGH (ref 5–15)
BUN: 19 mg/dL (ref 6–20)
CO2: 22 mmol/L (ref 22–32)
Calcium: 9.4 mg/dL (ref 8.9–10.3)
Chloride: 101 mmol/L (ref 98–111)
Creatinine, Ser: 1.33 mg/dL — ABNORMAL HIGH (ref 0.44–1.00)
GFR, Estimated: 54 mL/min — ABNORMAL LOW (ref >60)
Glucose, Bld: 172 mg/dL — ABNORMAL HIGH (ref 70–99)
Potassium: 5.4 mmol/L — ABNORMAL HIGH (ref 3.5–5.1)
Sodium: 141 mmol/L (ref 135–145)
Total Bilirubin: 0.7 mg/dL (ref 0.0–1.2)
Total Protein: 8.2 g/dL — ABNORMAL HIGH (ref 6.5–8.1)

## 2024-03-03 LAB — CBC
HCT: 49.9 % — ABNORMAL HIGH (ref 36.0–46.0)
Hemoglobin: 16.9 g/dL — ABNORMAL HIGH (ref 12.0–15.0)
MCH: 33.4 pg (ref 26.0–34.0)
MCHC: 33.9 g/dL (ref 30.0–36.0)
MCV: 98.6 fL (ref 80.0–100.0)
Platelets: 301 10*3/uL (ref 150–400)
RBC: 5.06 MIL/uL (ref 3.87–5.11)
RDW: 13.2 % (ref 11.5–15.5)
WBC: 15.2 10*3/uL — ABNORMAL HIGH (ref 4.0–10.5)
nRBC: 0 % (ref 0.0–0.2)

## 2024-03-03 LAB — LIPASE, BLOOD: Lipase: 26 U/L (ref 11–51)

## 2024-03-03 MED ORDER — SODIUM CHLORIDE 0.9 % IV BOLUS
1000.0000 mL | Freq: Once | INTRAVENOUS | Status: AC
  Administered 2024-03-03: 1000 mL via INTRAVENOUS

## 2024-03-03 MED ORDER — ONDANSETRON HCL 4 MG/2ML IJ SOLN
4.0000 mg | Freq: Once | INTRAMUSCULAR | Status: AC
  Administered 2024-03-03: 4 mg via INTRAVENOUS
  Filled 2024-03-03: qty 2

## 2024-03-03 MED ORDER — LOPERAMIDE HCL 2 MG PO CAPS
4.0000 mg | ORAL_CAPSULE | Freq: Once | ORAL | Status: AC
  Administered 2024-03-03: 4 mg via ORAL
  Filled 2024-03-03: qty 2

## 2024-03-03 MED ORDER — ONDANSETRON 4 MG PO TBDP: 4.0000 mg | ORAL_TABLET | Freq: Three times a day (TID) | ORAL | 0 refills | Status: AC | PRN

## 2024-03-03 NOTE — ED Notes (Signed)
 Ed provider at bedside

## 2024-03-03 NOTE — ED Provider Notes (Signed)
 [facility] Provider Note    Event Date/Time   First MD Initiated Contact with Patient 03/03/24 0001     (approximate)  History   Chief Complaint: Emesis and Rectal Bleeding  HPI  Mariah Sullivan is a 33 y.o. female with a past medical history of anemia, anxiety, presents to the emergency department for nausea vomiting diarrhea.  According to the patient approximately 36 hours ago she developed nausea vomiting and diarrhea.  Patient states she has had liquid stools and has noticed a pink tint to the stool at times.  Patient states her son had nausea vomiting and diarrhea starting approximately 1 day before her symptoms began.  Patient states mild vague abdominal cramping but denies any focal pain.  No fever.  Patient does have dry mucous membranes and mild tachycardia.  Physical Exam   Triage Vital Signs: ED Triage Vitals  Encounter Vitals Group     BP 03/02/24 2346 95/77     Systolic BP Percentile --      Diastolic BP Percentile --      Pulse Rate 03/02/24 2346 (!) 112     Resp 03/02/24 2346 18     Temp 03/02/24 2348 98.9 F (37.2 C)     Temp Source 03/02/24 2348 Oral     SpO2 03/02/24 2346 97 %     Weight 03/02/24 2344 130 lb (59 kg)     Height 03/02/24 2344 5\' 5"  (1.651 m)     Head Circumference --      Peak Flow --      Pain Score --      Pain Loc --      Pain Education --      Exclude from Growth Chart --     Most recent vital signs: Vitals:   03/02/24 2346 03/02/24 2348  BP: 95/77   Pulse: (!) 112   Resp: 18   Temp:  98.9 F (37.2 C)  SpO2: 97%     General: Awake, no distress.  Dry mucous membranes. CV:  Good peripheral perfusion.  Regular rate and rhythm around 100 bpm. Resp:  Normal effort.  Equal breath sounds bilaterally.  Abd:  No distention.  Soft, mild diffuse tenderness without focal tenderness identified.  No rebound or guarding.  ED Results / Procedures / Treatments   MEDICATIONS ORDERED IN ED: Medications   loperamide (IMODIUM) capsule 4 mg (has no administration in time range)  ondansetron (ZOFRAN) injection 4 mg (has no administration in time range)  sodium chloride 0.9 % bolus 1,000 mL (has no administration in time range)  sodium chloride 0.9 % bolus 1,000 mL (has no administration in time range)     IMPRESSION / MDM / ASSESSMENT AND PLAN / ED COURSE  I reviewed the triage vital signs and the nursing notes.  Patient's presentation is most consistent with acute presentation with potential threat to life or bodily function.  Patient presents to the emergency department for nausea vomiting diarrhea starting approximately 36 hours ago.  Patient states she has not been able to keep down any fluids today.  Very dry appearing mucous membranes, tachycardic between 100 - 110 bpm.  Reassuringly no fever.  Son started with symptoms 1 day before her symptoms began highly suspect gastroenteritis/norovirus.  Patient states she had noticed a pink her blood discoloration to her stool at times.  We will check labs, IV hydrate, treat with Zofran loperamide and continue to closely monitor.  Patient agreeable to plan of care.  Lab work is reassuring, chemistry does show dehydration with anion gap of 18 and mild renal insufficiency.  Lipase is normal LFTs are normal.  Slight leukocytosis otherwise reassuring CBC.  Patient has received 2 L of fluids as well as Zofran and loperamide.  Patient states she is feeling much better she has been able to tolerate oral fluids.  Highly suspect norovirus/viral gastroenteritis.  Will discharge home with PCP follow-up and a prescription for Zofran.  Discussed supportive care and rest as well as significant hydration.  Patient agreeable to plan.  FINAL CLINICAL IMPRESSION(S) / ED DIAGNOSES   Gastroenteritis   Note:  This document was prepared using Dragon voice recognition software and may include unintentional dictation errors.   Minna Antis, MD 03/03/24 [phone]

## 2024-03-03 NOTE — ED Notes (Addendum)
 Pt resting. Feels much improved. Fluids still infusing. Pt encouraged to provide urine sample. Stated she didn't feel like she could urinate at this time. Pt tried earlier as well.

## 2024-03-03 NOTE — Discharge Instructions (Addendum)
 Please drink plenty of fluids.  Obtain plenty rest.  Return to the emergency department for any worsening symptoms, any significant abdominal pain, or any other symptom personally concerning to yourself.

## 2024-06-15 DIAGNOSIS — L409 Psoriasis, unspecified: Secondary | ICD-10-CM | POA: Diagnosis not present

## 2024-06-15 DIAGNOSIS — Z6821 Body mass index (BMI) 21.0-21.9, adult: Secondary | ICD-10-CM | POA: Diagnosis not present

## 2024-06-15 DIAGNOSIS — R1110 Vomiting, unspecified: Secondary | ICD-10-CM | POA: Diagnosis not present

## 2024-06-15 DIAGNOSIS — Z3040 Encounter for surveillance of contraceptives, unspecified: Secondary | ICD-10-CM | POA: Diagnosis not present

## 2024-07-04 ENCOUNTER — Encounter: Payer: Self-pay | Admitting: Emergency Medicine

## 2024-07-04 ENCOUNTER — Ambulatory Visit
Admission: EM | Admit: 2024-07-04 | Discharge: 2024-07-04 | Disposition: A | Discharge status: Home / Self Care | Attending: Emergency Medicine | Admitting: Emergency Medicine

## 2024-07-04 DIAGNOSIS — L509 Urticaria, unspecified: Secondary | ICD-10-CM

## 2024-07-04 MED ORDER — PREDNISONE 10 MG (21) PO TBPK: ORAL_TABLET | Freq: Every day | ORAL | 0 refills | Status: DC

## 2024-07-04 MED ORDER — DEXAMETHASONE SODIUM PHOSPHATE 10 MG/ML IJ SOLN
10.0000 mg | Freq: Once | INTRAMUSCULAR | Status: AC
  Administered 2024-07-04: 10 mg via INTRAMUSCULAR

## 2024-07-04 NOTE — Discharge Instructions (Signed)
 Today you are being treated for a rash which appears inflammatory  You have been given an injection of steroids today in the office today to help reduce the inflammatory process that occurs with this rash which will help minimize your itching as well as begin to clear  Starting tomorrow take prednisone  every morning with food as directed, to continue the above process to wait 4 hours after administration of steroids before breast-feeding to minimize the amount that may cross into the breastmilk  You may continue use of topical calamine or Benadryl  cream to help manage itching, you may also continue oral Benadryl   Please avoid long exposures to heat such as a hot steamy shower or being outside as this may cause further irritation to your rash  You may follow-up with his urgent care as needed if symptoms persist or worsen

## 2024-07-04 NOTE — ED Triage Notes (Signed)
 Patient reports itching with hives all over that started Saturday. Patient reports using benadryl  with no relief. Last dose of Benadryl  was at 9 am today.

## 2024-07-04 NOTE — ED Provider Notes (Signed)
 [facility]    CSN: [id number] Arrival date & time: 07/04/24  1506      History   Chief Complaint Chief Complaint  Patient presents with   Urticaria    HPI Mariah Sullivan is a 33 y.o. female.   Patient presents for evaluation of a erythematous generalized rash beginning 3 days ago.  Denies changes in toiletries, diet or recent travel, no known sick contacts with similar symptoms.  Endorses that she has had a history since childhood of hives occurring sporadically.  History of psoriasis.  Endorses she is currently waiting for dermatology appointment, was given referral by primary doctor.  Attempted use of Benadryl   Past Medical History:  Diagnosis Date   ADHD    Anemia    Anemia 03/20/2022   Angio-edema    Anxiety    Anxiety 03/02/2018   Asthma    Attention deficit hyperactivity disorder 03/20/2022   Chronic urticaria    Depression    Eczema    Eosinophilia    Eosinophilia, unspecified 03/20/2022   Gastroesophageal reflux disease without esophagitis 03/20/2022   GERD (gastroesophageal reflux disease)    Gestational hypertension 07/13/2023   Major depression, single episode 03/20/2022   Psoriasis    Psoriasis 03/20/2022   Ulcer    Urticaria     There are no active problems to display for this patient.   Past Surgical History:  Procedure Laterality Date   ADENOIDECTOMY     FRACTURE SURGERY     SINOSCOPY     TYMPANOSTOMY TUBE PLACEMENT      OB History     Gravida  2   Para  2   Term  2   Preterm      AB      Living  2      SAB      IAB      Ectopic      Multiple  0   Live Births  2            Home Medications    Prior to Admission medications   Medication Sig Start Date End Date Taking? Authorizing Provider  predniSONE  (STERAPRED UNI-PAK 21 TAB) 10 MG (21) TBPK tablet Take by mouth daily. Take 6 tabs by mouth daily  for 1 days, then 5 tabs for 1 days, then 4 tabs for 1 days, then 3 tabs for 1 days, 2 tabs for 1  days, then 1 tab by mouth daily for 1 days 07/04/24  Yes Cace Osorto R, NP  acetaminophen  (TYLENOL ) 325 MG tablet Take 2 tablets (650 mg total) by mouth every 4 (four) hours as needed (for pain scale < 4). 07/14/23   Cresenzo, John V, MD  amphetamine-dextroamphetamine (ADDERALL) 20 MG tablet Take 20 mg by mouth 3 (three) times daily.    [provider]  cyanocobalamin  (VITAMIN B12) 500 MCG tablet Take 500 mcg by mouth daily.    [provider]  ferrous sulfate  324 MG TBEC Take 324 mg by mouth daily.    [provider]  FLUoxetine  (PROZAC ) 20 MG capsule Take 20 mg by mouth at bedtime.    [provider]  norethindrone  (ORTHO MICRONOR ) 0.35 MG tablet Take 1 tablet (0.35 mg total) by mouth daily. Patient not taking: Reported on 08/26/2023 07/14/23   Cresenzo, John V, MD  ondansetron  (ZOFRAN -ODT) 4 MG disintegrating tablet Take 1 tablet (4 mg total) by mouth every 8 (eight) hours as needed for nausea or vomiting. 03/03/24  Dorothyann Drivers, MD  Prenatal Vit-Fe Fumarate-FA (PRENATAL MULTIVITAMIN) TABS tablet Take 1 tablet by mouth daily at 12 noon.    [provider]    Family History Family History  Problem Relation Age of Onset   Allergic rhinitis Mother    Hyperlipidemia Mother    Asthma Father    Cancer Father    Diabetes Maternal Grandmother    Diabetes Maternal Grandfather    Heart disease Maternal Grandfather    Hyperlipidemia Maternal Grandfather    Stroke Paternal Grandmother    Eczema Neg Hx    Urticaria Neg Hx     Social History Social History   Tobacco Use   Smoking status: Former    Current packs/day: 0.50    Average packs/day: 0.5 packs/day for 6.0 years (3.0 ttl pk-yrs)    Types: Cigarettes   Smokeless tobacco: Never  Vaping Use   Vaping status: Some Days  Substance Use Topics   Alcohol use: Yes    Comment: social use not very often   Drug use: Yes    Types: Marijuana    Comment: marijuana 1 time per week      Allergies   Latex   Review of Systems Review of Systems   Physical Exam Triage Vital Signs ED Triage Vitals  Encounter Vitals Group     BP 07/04/24 1517 117/76     Girls Systolic BP Percentile --      Girls Diastolic BP Percentile --      Boys Systolic BP Percentile --      Boys Diastolic BP Percentile --      Pulse Rate 07/04/24 1517 81     Resp 07/04/24 1517 18     Temp 07/04/24 1517 99 F (37.2 C)     Temp Source 07/04/24 1517 Oral     SpO2 07/04/24 1517 99 %     Weight --      Height --      Head Circumference --      Peak Flow --      Pain Score 07/04/24 1519 0     Pain Loc --      Pain Education --      Exclude from Growth Chart --    No data found.  Updated Vital Signs BP 117/76 (BP Location: Left Arm)   Pulse 81   Temp 99 F (37.2 C) (Oral)   Resp 18   LMP  (LMP Unknown)   SpO2 99%   Breastfeeding Yes   Visual Acuity Right Eye Distance:   Left Eye Distance:   Bilateral Distance:    Right Eye Near:   Left Eye Near:    Bilateral Near:     Physical Exam Constitutional:      Appearance: Normal appearance.  Eyes:     Extraocular Movements: Extraocular movements intact.  Pulmonary:     Effort: Pulmonary effort is normal.  Skin:    Comments: Generalized erythematous maculopapular rash present  Neurological:     Mental Status: She is alert and oriented to person, place, and time. Mental status is at baseline.      UC Treatments / Results  Labs (all labs ordered are listed, but only abnormal results are displayed) Labs Reviewed - No data to display  EKG   Radiology No results found.  Procedures Procedures (including critical care time)  Medications Ordered in UC Medications  dexamethasone  (DECADRON ) injection 10 mg (10 mg Intramuscular Given 07/04/24 1534)    Initial Impression /  Assessment and Plan / UC Course  I have reviewed the triage vital signs and the nursing notes.  Pertinent labs & imaging results that were  available during my care of the patient were reviewed by me and considered in my medical decision making (see chart for details).  Hives  Rash appears to be inflammatory, no signs of infection, discussed this with patient, Decadron  IM given and prescribed oral prednisone , currently breast-feeding only at nighttime, medication to be taken in the morning but did advise patient to wait 4 hours after administration before feeding or pumping, verbalized understanding, recommended supportive care for management of pruritus and advised follow-up with urgent care as needed Final Clinical Impressions(s) / UC Diagnoses   Final diagnoses:  Hives     Discharge Instructions      Today you are being treated for a rash which appears inflammatory  You have been given an injection of steroids today in the office today to help reduce the inflammatory process that occurs with this rash which will help minimize your itching as well as begin to clear  Starting tomorrow take prednisone  every morning with food as directed, to continue the above process to wait 4 hours after administration of steroids before breast-feeding to minimize the amount that may cross into the breastmilk  You may continue use of topical calamine or Benadryl  cream to help manage itching, you may also continue oral Benadryl   Please avoid long exposures to heat such as a hot steamy shower or being outside as this may cause further irritation to your rash  You may follow-up with his urgent care as needed if symptoms persist or worsen    ED Prescriptions     Medication Sig Dispense Auth. Provider   predniSONE  (STERAPRED UNI-PAK 21 TAB) 10 MG (21) TBPK tablet Take by mouth daily. Take 6 tabs by mouth daily  for 1 days, then 5 tabs for 1 days, then 4 tabs for 1 days, then 3 tabs for 1 days, 2 tabs for 1 days, then 1 tab by mouth daily for 1 days 21 tablet Heli Dino, Shelba SAUNDERS, NP      PDMP not reviewed this encounter.   Teresa Shelba SAUNDERS, [place] 07/04/24 [phone]

## 2024-07-26 DIAGNOSIS — F909 Attention-deficit hyperactivity disorder, unspecified type: Secondary | ICD-10-CM | POA: Diagnosis not present

## 2024-09-03 ENCOUNTER — Ambulatory Visit
Admission: EM | Admit: 2024-09-03 | Discharge: 2024-09-03 | Disposition: A | Discharge status: Home / Self Care | Attending: Emergency Medicine | Admitting: Emergency Medicine

## 2024-09-03 ENCOUNTER — Encounter: Payer: Self-pay | Admitting: Emergency Medicine

## 2024-09-03 DIAGNOSIS — R21 Rash and other nonspecific skin eruption: Secondary | ICD-10-CM | POA: Diagnosis not present

## 2024-09-03 MED ORDER — DEXAMETHASONE SODIUM PHOSPHATE 10 MG/ML IJ SOLN
10.0000 mg | Freq: Once | INTRAMUSCULAR | Status: AC
  Administered 2024-09-03: 10 mg via INTRAMUSCULAR

## 2024-09-03 MED ORDER — PREDNISONE 10 MG (21) PO TBPK: ORAL_TABLET | Freq: Every day | ORAL | 0 refills | Status: AC

## 2024-09-03 NOTE — Discharge Instructions (Addendum)
 Today you are being treated for the your rash which I believe is a inflammatory condition, keep upcoming appointment with the dermatology  You have been given an injection of steroids today in the office today to help reduce the inflammatory process that occurs with this rash which will help minimize your itching as well as begin to clear  Starting tomorrow take prednisone  every morning with food as directed, to continue the above process  You may continue use of topical calamine or Benadryl  cream to help manage itching, you may also continue oral Benadryl   Please avoid long exposures to heat such as a hot steamy shower or being outside as this may cause further irritation to your rash  You may follow-up with his urgent care as needed if symptoms persist or worsen

## 2024-09-03 NOTE — ED Provider Notes (Signed)
 [facility]    CSN: [id number] Arrival date & time: 09/03/24  1127      History   Chief Complaint Chief Complaint  Patient presents with   Rash    HPI Mariah Sullivan is a 33 y.o. female.   Patient presents for evaluation of persisting erythematous pruritic rash generalized to the body beginning approximately 2 months ago.  Described as hives and blistering in appearance with clear fluid coming from the blisters only when areas scratched.  symptoms have worsened over the past 1 to 2 weeks when her child started having similar symptoms.  Endorses improvement with use of steroids which she received at a visit on July 04, 2024 for same symptoms.  Has upcoming dermatology appointment on 09/22/2024, patient believes that she has psoriasis.  Rash has occurred before in the past.  Denies changes in diet, medication, toiletries or recent travel.   Past Medical History:  Diagnosis Date   ADHD    Anemia    Anemia 03/20/2022   Angio-edema    Anxiety    Anxiety 03/02/2018   Asthma    Attention deficit hyperactivity disorder 03/20/2022   Chronic urticaria    Depression    Eczema    Eosinophilia    Eosinophilia, unspecified 03/20/2022   Gastroesophageal reflux disease without esophagitis 03/20/2022   GERD (gastroesophageal reflux disease)    Gestational hypertension 07/13/2023   Major depression, single episode 03/20/2022   Psoriasis    Psoriasis 03/20/2022   Ulcer    Urticaria     There are no active problems to display for this patient.   Past Surgical History:  Procedure Laterality Date   ADENOIDECTOMY     FRACTURE SURGERY     SINOSCOPY     TYMPANOSTOMY TUBE PLACEMENT      OB History     Gravida  2   Para  2   Term  2   Preterm      AB      Living  2      SAB      IAB      Ectopic      Multiple  0   Live Births  2            Home Medications    Prior to Admission medications   Medication Sig Start Date End Date  Taking? Authorizing Provider  predniSONE  (STERAPRED UNI-PAK 21 TAB) 10 MG (21) TBPK tablet Take by mouth daily. Take 6 tabs by mouth daily  for 1 days, then 5 tabs for 1 days, then 4 tabs for 1 days, then 3 tabs for 1 days, 2 tabs for 1 days, then 1 tab by mouth daily for 1 days 09/03/24  Yes Elissia Spiewak R, NP  acetaminophen  (TYLENOL ) 325 MG tablet Take 2 tablets (650 mg total) by mouth every 4 (four) hours as needed (for pain scale < 4). 07/14/23   Cresenzo, John V, MD  amphetamine-dextroamphetamine (ADDERALL) 20 MG tablet Take 20 mg by mouth 3 (three) times daily.    [provider]  cyanocobalamin  (VITAMIN B12) 500 MCG tablet Take 500 mcg by mouth daily.    [provider]  ferrous sulfate  324 MG TBEC Take 324 mg by mouth daily.    [provider]  FLUoxetine  (PROZAC ) 20 MG capsule Take 20 mg by mouth at bedtime.    [provider]  norethindrone  (ORTHO MICRONOR ) 0.35 MG tablet Take 1 tablet (0.35 mg total) by mouth  daily. Patient not taking: Reported on 08/26/2023 07/14/23   Cresenzo, John V, MD  ondansetron  (ZOFRAN -ODT) 4 MG disintegrating tablet Take 1 tablet (4 mg total) by mouth every 8 (eight) hours as needed for nausea or vomiting. 03/03/24   Dorothyann Drivers, MD  Prenatal Vit-Fe Fumarate-FA (PRENATAL MULTIVITAMIN) TABS tablet Take 1 tablet by mouth daily at 12 noon.    [provider]    Family History Family History  Problem Relation Age of Onset   Allergic rhinitis Mother    Hyperlipidemia Mother    Asthma Father    Cancer Father    Diabetes Maternal Grandmother    Diabetes Maternal Grandfather    Heart disease Maternal Grandfather    Hyperlipidemia Maternal Grandfather    Stroke Paternal Grandmother    Eczema Neg Hx    Urticaria Neg Hx     Social History Social History   Tobacco Use   Smoking status: Former    Current packs/day: 0.50    Average packs/day: 0.5 packs/day for 6.0 years (3.0 ttl pk-yrs)    Types: Cigarettes    Smokeless tobacco: Never  Vaping Use   Vaping status: Some Days  Substance Use Topics   Alcohol use: Yes    Comment: social use not very often   Drug use: Yes    Types: Marijuana    Comment: marijuana 1 time per week     Allergies   Latex   Review of Systems Review of Systems   Physical Exam Triage Vital Signs ED Triage Vitals  Encounter Vitals Group     BP 09/03/24 1244 119/80     Girls Systolic BP Percentile --      Girls Diastolic BP Percentile --      Boys Systolic BP Percentile --      Boys Diastolic BP Percentile --      Pulse Rate 09/03/24 1244 83     Resp 09/03/24 1244 18     Temp 09/03/24 1244 98.3 F (36.8 C)     Temp Source 09/03/24 1244 Oral     SpO2 09/03/24 1244 100 %     Weight --      Height --      Head Circumference --      Peak Flow --      Pain Score 09/03/24 1242 0     Pain Loc --      Pain Education --      Exclude from Growth Chart --    No data found.  Updated Vital Signs BP 119/80 (BP Location: Left Arm)   Pulse 83   Temp 98.3 F (36.8 C) (Oral)   Resp 18   LMP 08/22/2024 (Approximate)   SpO2 100%   Breastfeeding Yes   Visual Acuity Right Eye Distance:   Left Eye Distance:   Bilateral Distance:    Right Eye Near:   Left Eye Near:    Bilateral Near:     Physical Exam Constitutional:      Appearance: Normal appearance.  Eyes:     Extraocular Movements: Extraocular movements intact.  Pulmonary:     Effort: Pulmonary effort is normal.  Neurological:     Mental Status: She is alert and oriented to person, place, and time. Mental status is at baseline.      UC Treatments / Results  Labs (all labs ordered are listed, but only abnormal results are displayed) Labs Reviewed - No data to display  EKG   Radiology No results found.  Procedures Procedures (including critical care time)  Medications Ordered in UC Medications  dexamethasone  (DECADRON ) injection 10 mg (10 mg Intramuscular Given 09/03/24 1310)     Initial Impression / Assessment and Plan / UC Course  I have reviewed the triage vital signs and the nursing notes.  Pertinent labs & imaging results that were available during my care of the patient were reviewed by me and considered in my medical decision making (see chart for details).  Rash  Appears inflammatory, no signs of infection at this time, stable for outpatient management, had improvement with use of steroids therefore Decadron  IM given today in bedside and prednisone  taper prescribed for home use recommended supportive care for management of pruritus and advised to keep upcoming dermatology appointment for further management Final Clinical Impressions(s) / UC Diagnoses   Final diagnoses:  Rash and nonspecific skin eruption     Discharge Instructions      Today you are being treated for the your rash which I believe is a inflammatory condition, keep upcoming appointment with the dermatology  You have been given an injection of steroids today in the office today to help reduce the inflammatory process that occurs with this rash which will help minimize your itching as well as begin to clear  Starting tomorrow take prednisone  every morning with food as directed, to continue the above process  You may continue use of topical calamine or Benadryl  cream to help manage itching, you may also continue oral Benadryl   Please avoid long exposures to heat such as a hot steamy shower or being outside as this may cause further irritation to your rash  You may follow-up with his urgent care as needed if symptoms persist or worsen    ED Prescriptions     Medication Sig Dispense Auth. Provider   predniSONE  (STERAPRED UNI-PAK 21 TAB) 10 MG (21) TBPK tablet Take by mouth daily. Take 6 tabs by mouth daily  for 1 days, then 5 tabs for 1 days, then 4 tabs for 1 days, then 3 tabs for 1 days, 2 tabs for 1 days, then 1 tab by mouth daily for 1 days 21 tablet Ayven Pheasant, Shelba SAUNDERS, NP       PDMP not reviewed this encounter.   Teresa Shelba SAUNDERS, NP 09/03/24 1326

## 2024-09-03 NOTE — ED Triage Notes (Signed)
 Patient reports rash that has been going on for 2 months. Patient was seen here at [facility] for the same.  Patient reports she has an appointment at dermatologist on 10/3.25.

## 2024-09-22 DIAGNOSIS — L309 Dermatitis, unspecified: Secondary | ICD-10-CM | POA: Diagnosis not present

## 2024-10-02 ENCOUNTER — Other Ambulatory Visit: Payer: Self-pay

## 2024-10-02 ENCOUNTER — Emergency Department
Admission: EM | Admit: 2024-10-02 | Discharge: 2024-10-02 | Disposition: A | Discharge status: Home / Self Care | Attending: Emergency Medicine | Admitting: Emergency Medicine

## 2024-10-02 DIAGNOSIS — L03114 Cellulitis of left upper limb: Secondary | ICD-10-CM | POA: Diagnosis not present

## 2024-10-02 DIAGNOSIS — R21 Rash and other nonspecific skin eruption: Secondary | ICD-10-CM | POA: Diagnosis not present

## 2024-10-02 MED ORDER — CEPHALEXIN 500 MG PO CAPS: 500.0000 mg | ORAL_CAPSULE | Freq: Three times a day (TID) | ORAL | 0 refills | Status: AC

## 2024-10-02 NOTE — ED Provider Notes (Signed)
 [facility] Provider Note    Event Date/Time   First MD Initiated Contact with Patient 10/02/24 1905     (approximate)   History   Rash   HPI  Mariah Sullivan is a 33 year old female with history of psoriasis, recently diagnosed lichen planus presenting to the emergency department for evaluation with concerns for skin infection.  Patient reports about a week ago she had a biopsy performed of her upper arm by her dermatologist.  She recently learned that this demonstrated lichen planus.  She is on steroids from her dermatologist for this.  However, more recently she has noticed an increasing redness and swelling around the site of her biopsy and is concerned about an infection.  No reported fevers.     Physical Exam   Triage Vital Signs: ED Triage Vitals [10/02/24 1836]  Encounter Vitals Group     BP (!) 142/91     Girls Systolic BP Percentile      Girls Diastolic BP Percentile      Boys Systolic BP Percentile      Boys Diastolic BP Percentile      Pulse Rate (!) 103     Resp 18     Temp 98.8 F (37.1 C)     Temp Source Oral     SpO2 98 %     Weight      Height      Head Circumference      Peak Flow      Pain Score 5     Pain Loc      Pain Education      Exclude from Growth Chart     Most recent vital signs: Vitals:   10/02/24 1836  BP: (!) 142/91  Pulse: (!) 103  Resp: 18  Temp: 98.8 F (37.1 C)  SpO2: 98%     General: Awake, interactive  CV:  Good peripheral perfusion Resp:  Unlabored respirations Abd:  Nondistended.  Neuro:  Symmetric facial movement, fluid speech Other:   There is a small area over the left forearm with healing scab consistent with location of recent biopsy.  There is surrounding redness and induration as well as warmth.  No fluctuance.   ED Results / Procedures / Treatments   Labs (all labs ordered are listed, but only abnormal results are displayed) Labs Reviewed - No data to  display   EKG EKG independently reviewed and interpreted by myself demonstrates:    RADIOLOGY Imaging independently reviewed and interpreted by myself demonstrates:   Formal Radiology Read:  No results found.  PROCEDURES:  Critical Care performed: No  Procedures   MEDICATIONS ORDERED IN ED: Medications - No data to display   IMPRESSION / MDM / ASSESSMENT AND PLAN / ED COURSE  I reviewed the triage vital signs and the nursing notes.  Differential diagnosis includes, but is not limited to, cellulitis, postoperative granulation tissue, exacerbation of underlying skin disorder  Patient's presentation is most consistent with acute, uncomplicated illness.  33 year old female presenting with area of redness and warmth around site of recent skin biopsy.  No systemic symptoms.  On exam, there does appear to be some redness and swelling around the site of her biopsy concerning for developing cellulitis.  Do think it is reasonable to start the patient on antibiotics and have her follow-up with her dermatologist.  Will send prescription for Keflex.  Strict return precautions provided.  Patient discharged in stable condition.     FINAL CLINICAL IMPRESSION(S) /  ED DIAGNOSES   Final diagnoses:  Rash and nonspecific skin eruption  Cellulitis of left upper extremity     Rx / DC Orders   ED Discharge Orders          Ordered    cephALEXin (KEFLEX) 500 MG capsule  3 times daily        10/02/24 2016             Note:  This document was prepared using Dragon voice recognition software and may include unintentional dictation errors.   Levander Slate, MD 10/02/24 2020

## 2024-10-02 NOTE — ED Triage Notes (Signed)
 Pt to ED via POV from home. Pt reports rash all over body that has been present since July. Pt reports has been seen multiple times. Pt reports possible eczema.

## 2024-10-02 NOTE — Discharge Instructions (Addendum)
 I suspect you are developing cellulitis at the site of your recent skin biopsy.  I sent a prescription for an antibiotic to your pharmacy to help treat this.  Follow with your dermatologist for further evaluation.  Return to the ER for new or worsening symptoms.

## 2024-10-19 ENCOUNTER — Emergency Department: Admission: EM | Admit: 2024-10-19 | Discharge: 2024-10-19 | Disposition: A | Discharge status: Home / Self Care

## 2024-10-19 ENCOUNTER — Other Ambulatory Visit: Payer: Self-pay

## 2024-10-19 ENCOUNTER — Encounter: Payer: Self-pay | Admitting: Emergency Medicine

## 2024-10-19 DIAGNOSIS — N61 Mastitis without abscess: Secondary | ICD-10-CM | POA: Diagnosis not present

## 2024-10-19 DIAGNOSIS — O9123 Nonpurulent mastitis associated with lactation: Secondary | ICD-10-CM | POA: Insufficient documentation

## 2024-10-19 DIAGNOSIS — N644 Mastodynia: Secondary | ICD-10-CM | POA: Diagnosis present

## 2024-10-19 MED ORDER — OXYCODONE-ACETAMINOPHEN 5-325 MG PO TABS
1.0000 | ORAL_TABLET | Freq: Once | ORAL | Status: AC
  Administered 2024-10-19: 1 via ORAL
  Filled 2024-10-19: qty 1

## 2024-10-19 MED ORDER — DICLOXACILLIN SODIUM 500 MG PO CAPS: 500.0000 mg | ORAL_CAPSULE | Freq: Four times a day (QID) | ORAL | 0 refills | Status: AC

## 2024-10-19 MED ORDER — DICLOXACILLIN SODIUM 500 MG PO CAPS
500.0000 mg | ORAL_CAPSULE | Freq: Once | ORAL | Status: AC
  Administered 2024-10-19: 500 mg via ORAL
  Filled 2024-10-19: qty 1

## 2024-10-19 MED ORDER — OXYCODONE-ACETAMINOPHEN 5-325 MG PO TABS: 1.0000 | ORAL_TABLET | ORAL | 0 refills | Status: AC | PRN

## 2024-10-19 NOTE — ED Notes (Signed)
 Pt states Right breast pain 10/10.

## 2024-10-19 NOTE — Discharge Instructions (Addendum)
 Continue to express milk, manual expression if necessary.  Buy a breast massager and use warm heat packs.  Do not drive operate machinery if you take the Percocet.  Take the full course of your antibiotics.  Follow-up with OB/GYN and discussed the need for an early mammogram.  Return with any acutely worsening symptoms or any other emergency. -- RETURN PRECAUTIONS & AFTERCARE: (ENGLISH) RETURN PRECAUTIONS: Return immediately to the emergency department or see/call your doctor if you feel worse, weak or have changes in speech or vision, are short of breath, have fever, vomiting, pain, bleeding or dark stool, trouble urinating or any new issues. Return here or see/call your doctor if not improving as expected for your suspected condition. FOLLOW-UP CARE: Call your doctor and/or any doctors we referred you to for more advice and to make an appointment. Do this today, tomorrow or after the weekend. Some doctors only take PPO insurance so if you have HMO insurance you may want to contact your HMO or your regular doctor for referral to a specialist within your plan. Either way tell the doctor's office that it was a referral from the emergency department so you get the soonest possible appointment.  YOUR TEST RESULTS: Take result reports of any blood or urine tests, imaging tests and EKG's to your doctor and any referral doctor. Have any abnormal tests repeated. Your doctor or a referral doctor can let you know when this should be done. Also make sure your doctor contacts this hospital to get any test results that are not currently available such as cultures or special tests for infection and final imaging reports, which are often not available at the time you leave the ER but which may list additional important findings that are not documented on the preliminary report. BLOOD PRESSURE: If your blood pressure was greater than 120/80 have your blood pressure rechecked within 1 to 2 weeks. MEDICATION SIDE EFFECTS: Do  not drive, walk, bike, take the bus, etc. if you have received or are being prescribed any sedating medications such as those for pain or anxiety or certain antihistamines like Benadryl . If you have been give one of these here get a taxi home or have a friend drive you home. Ask your pharmacist to counsel you on potential side effects of any new medication

## 2024-10-19 NOTE — ED Triage Notes (Signed)
 Patient to ED via POV for right breast pain. Currently breast feeding. States painful and swelling since yesterday. Only able to pump a few drops of breast milk.

## 2024-10-19 NOTE — ED Provider Notes (Signed)
 [facility] Provider Note    Event Date/Time   First MD Initiated Contact with Patient 10/19/24 1017     (approximate)   History   Breast Pain   HPI  Mariah Sullivan is a 33 y.o. female who is currently breast-feeding her 75-month-old who presents with 24 hours of progressively worsening right breast pain.  Patient reports a history of clogged milk duct but nothing like this.  The breast has recently become hard and more erythematous.  Denies any fevers but states that she feels unwell.  She has been placing ice packs to the area.  She tells me that she was on Keflex earlier this month for a skin infection.  Denies any abdominal pain pain in her other breast changes in urinary or bowel habits.  No SI HI or AVH S      Physical Exam   Triage Vital Signs: ED Triage Vitals [10/19/24 1014]  Encounter Vitals Group     BP (!) 120/96     Girls Systolic BP Percentile      Girls Diastolic BP Percentile      Boys Systolic BP Percentile      Boys Diastolic BP Percentile      Pulse Rate (!) 112     Resp 17     Temp 98.6 F (37 C)     Temp Source Oral     SpO2 100 %     Weight 135 lb (61.2 kg)     Height 5' 5 (1.651 m)     Head Circumference      Peak Flow      Pain Score 9     Pain Loc      Pain Education      Exclude from Growth Chart     Most recent vital signs: Vitals:   10/19/24 1014  BP: (!) 120/96  Pulse: (!) 112  Resp: 17  Temp: 98.6 F (37 C)  SpO2: 100%    Nursing Triage Note reviewed. Vital signs reviewed and patients oxygen saturation is normoxic  General: Patient is well nourished, well developed, awake and alert, resting comfortably in no acute distress Head: Normocephalic and atraumatic Eyes: Normal inspection, extraocular muscles intact, no conjunctival pallor Ear, nose, throat: Normal external exam Neck: Normal range of motion Respiratory: Patient is in no respiratory distress, lungs CTAB Right breast: No nipple  deformity, right breast is hard/firm with erythema on the lateral side consistent with mastitis Left breast: No acute abnormalities Cardiovascular: Patient is tachycardic, RR GI: Abd SNT with no guarding or rebound  Back: Normal inspection of the back with good strength and range of motion throughout all ext Extremities: pulses intact with good cap refills, no LE pitting edema or calf tenderness Neuro: The patient is alert and oriented to person, place, and time, appropriately conversive, with 5/5 bilat UE/LE strength, no gross motor or sensory defects noted. Coordination appears to be adequate. Skin: Warm, dry, and intact Psych: normal mood and affect, no SI or HI  ED Results / Procedures / Treatments   Labs (all labs ordered are listed, but only abnormal results are displayed) Labs Reviewed - No data to display   EKG   RADIOLOGY None    PROCEDURES:  Critical Care performed: No  Procedures   MEDICATIONS ORDERED IN ED: Medications  dicloxacillin (DYNAPEN) capsule 500 mg (has no administration in time range)  oxyCODONE -acetaminophen  (PERCOCET/ROXICET) 5-325 MG per tablet 1 tablet (1 tablet Oral Given 10/19/24 1037)  IMPRESSION / MDM / ASSESSMENT AND PLAN / ED COURSE                                Differential diagnosis includes, but is not limited to, mastitis, clogged milk duct, cellulitis, breast mass   ED course: Patient's history and physical consistent with mastitis.  Patient was given heat packs and counseled on manual expression.  Will start a course of dicloxacillin 500 mg 4 times daily x 10 days.  A brief course of Percocet for pain has been administered as well and patient was counseled not to drive operate machinery and not to feed the milk 1 hour after expression of taking this medication.  She was counseled on the need to discuss with gynecology the possibility of an early mammogram.  All questions answered and patient voiced understanding and requested  discharge  At time of discharge there is no evidence of acute life, limb, vision, or fertility threat. Patient has stable vital signs, pain is well controlled, patient is ambulatory and p.o. tolerant.  Discharge instructions were completed using the EPIC system. I would refer you to those at this time. All warnings prescriptions follow-up etc. were discussed in detail with the patient. Patient indicates understanding and is agreeable with this plan. All questions answered.  Patient is made aware that they may return to the emergency department for any worsening or new condition or for any other emergency.      -- Risk: 5 This patient has a high risk of morbidity due to further diagnostic testing or treatment. Rationale: This patient's evaluation and management involve a high risk of morbidity due to the potential severity of presenting symptoms, need for diagnostic testing, and/or initiation of treatment that may require close monitoring. The differential includes conditions with potential for significant deterioration or requiring escalation of care. Treatment decisions in the ED, including medication administration, procedural interventions, or disposition planning, reflect this level of risk. COPA: 5 The patient has the following acute or chronic illness/injury that poses a possible threat to life or bodily function: [X] : The patient has a potentially serious acute condition or an acute exacerbation of a chronic illness requiring urgent evaluation and management in the Emergency Department. The clinical presentation necessitates immediate consideration of life-threatening or function-threatening diagnoses, even if they are ultimately ruled out.   FINAL CLINICAL IMPRESSION(S) / ED DIAGNOSES   Final diagnoses:  Mastitis associated with lactation     Rx / DC Orders   ED Discharge Orders          Ordered    dicloxacillin (DYNAPEN) 500 MG capsule  4 times daily        10/19/24 1032     oxyCODONE -acetaminophen  (PERCOCET) 5-325 MG tablet  Every 4 hours PRN        10/19/24 1032             Note:  This document was prepared using Dragon voice recognition software and may include unintentional dictation errors.   Nicholaus Rolland BRAVO, MD 10/19/24 [phone]

## 2024-11-01 DIAGNOSIS — Z79899 Other long term (current) drug therapy: Secondary | ICD-10-CM | POA: Diagnosis not present

## 2024-11-01 DIAGNOSIS — L438 Other lichen planus: Secondary | ICD-10-CM | POA: Diagnosis not present
